# Patient Record
Sex: Female | Born: 1985 | Race: White | Hispanic: No | Marital: Married | Smoking: Former smoker
Health system: Southern US, Community
[De-identification: ages and names within clinical notes are randomized; demographics above are authoritative.]

## PROBLEM LIST (undated history)

## (undated) ENCOUNTER — Inpatient Hospital Stay (HOSPITAL_COMMUNITY): Payer: Self-pay

## (undated) DIAGNOSIS — F329 Major depressive disorder, single episode, unspecified: Secondary | ICD-10-CM

## (undated) DIAGNOSIS — N96 Recurrent pregnancy loss: Secondary | ICD-10-CM

## (undated) DIAGNOSIS — Z8619 Personal history of other infectious and parasitic diseases: Secondary | ICD-10-CM

## (undated) DIAGNOSIS — N200 Calculus of kidney: Secondary | ICD-10-CM

## (undated) DIAGNOSIS — B009 Herpesviral infection, unspecified: Secondary | ICD-10-CM

## (undated) DIAGNOSIS — J45909 Unspecified asthma, uncomplicated: Secondary | ICD-10-CM

## (undated) DIAGNOSIS — F909 Attention-deficit hyperactivity disorder, unspecified type: Secondary | ICD-10-CM

## (undated) DIAGNOSIS — F419 Anxiety disorder, unspecified: Secondary | ICD-10-CM

## (undated) DIAGNOSIS — L650 Telogen effluvium: Secondary | ICD-10-CM

## (undated) DIAGNOSIS — Z8659 Personal history of other mental and behavioral disorders: Secondary | ICD-10-CM

## (undated) DIAGNOSIS — R002 Palpitations: Secondary | ICD-10-CM

## (undated) DIAGNOSIS — F32A Depression, unspecified: Secondary | ICD-10-CM

## (undated) HISTORY — DX: Major depressive disorder, single episode, unspecified: F32.9

## (undated) HISTORY — PX: WISDOM TOOTH EXTRACTION: SHX21

## (undated) HISTORY — PX: DILATION AND CURETTAGE OF UTERUS: SHX78

## (undated) HISTORY — DX: Recurrent pregnancy loss: N96

## (undated) HISTORY — PX: KIDNEY STONE SURGERY: SHX686

## (undated) HISTORY — DX: Personal history of other mental and behavioral disorders: Z86.59

## (undated) HISTORY — DX: Depression, unspecified: F32.A

## (undated) HISTORY — DX: Herpesviral infection, unspecified: B00.9

## (undated) HISTORY — DX: Calculus of kidney: N20.0

## (undated) HISTORY — DX: Attention-deficit hyperactivity disorder, unspecified type: F90.9

## (undated) HISTORY — DX: Anxiety disorder, unspecified: F41.9

## (undated) HISTORY — DX: Telogen effluvium: L65.0

## (undated) HISTORY — DX: Personal history of other infectious and parasitic diseases: Z86.19

## (undated) HISTORY — DX: Palpitations: R00.2

---
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
## 2008-10-08 ENCOUNTER — Emergency Department (HOSPITAL_BASED_OUTPATIENT_CLINIC_OR_DEPARTMENT_OTHER): Admission: EM | Admit: 2008-10-08 | Discharge: 2008-10-08 | Payer: Self-pay | Admitting: Emergency Medicine

## 2009-04-15 ENCOUNTER — Ambulatory Visit (HOSPITAL_COMMUNITY): Admission: RE | Admit: 2009-04-15 | Discharge: 2009-04-15 | Payer: Self-pay | Admitting: Psychiatry

## 2009-04-22 ENCOUNTER — Other Ambulatory Visit (HOSPITAL_COMMUNITY): Admission: RE | Admit: 2009-04-22 | Discharge: 2009-04-26 | Payer: Self-pay | Admitting: Psychiatry

## 2009-05-30 ENCOUNTER — Inpatient Hospital Stay (HOSPITAL_COMMUNITY): Admission: AD | Admit: 2009-05-30 | Discharge: 2009-05-30 | Payer: Self-pay | Admitting: Obstetrics and Gynecology

## 2010-07-08 ENCOUNTER — Inpatient Hospital Stay (HOSPITAL_COMMUNITY)
Admission: AD | Admit: 2010-07-08 | Discharge: 2010-07-08 | Payer: Self-pay | Source: Home / Self Care | Attending: Obstetrics and Gynecology | Admitting: Obstetrics and Gynecology

## 2010-07-08 LAB — WET PREP, GENITAL
Clue Cells Wet Prep HPF POC: NONE SEEN
Trich, Wet Prep: NONE SEEN
Yeast Wet Prep HPF POC: NONE SEEN

## 2010-07-08 LAB — HCG, QUANTITATIVE, PREGNANCY: hCG, Beta Chain, Quant, S: 1676 m[IU]/mL — ABNORMAL HIGH (ref <5)

## 2010-07-09 LAB — GC/CHLAMYDIA PROBE AMP, GENITAL
Chlamydia, DNA Probe: NEGATIVE
GC Probe Amp, Genital: NEGATIVE

## 2010-07-10 ENCOUNTER — Ambulatory Visit (HOSPITAL_COMMUNITY)
Admission: RE | Admit: 2010-07-10 | Discharge: 2010-07-10 | Payer: Self-pay | Source: Home / Self Care | Attending: Obstetrics and Gynecology | Admitting: Obstetrics and Gynecology

## 2010-09-01 ENCOUNTER — Ambulatory Visit (HOSPITAL_COMMUNITY)
Admission: RE | Admit: 2010-09-01 | Discharge: 2010-09-01 | Disposition: A | Discharge status: Home / Self Care | Payer: BC Managed Care – PPO | Source: Ambulatory Visit | Attending: Obstetrics and Gynecology | Admitting: Obstetrics and Gynecology

## 2010-09-01 ENCOUNTER — Other Ambulatory Visit: Payer: Self-pay | Admitting: Obstetrics and Gynecology

## 2010-09-01 DIAGNOSIS — O021 Missed abortion: Secondary | ICD-10-CM | POA: Insufficient documentation

## 2010-09-01 LAB — CBC
HCT: 39.6 % (ref 36.0–46.0)
Hemoglobin: 13.7 g/dL (ref 12.0–15.0)
MCH: 29.8 pg (ref 26.0–34.0)
MCHC: 34.6 g/dL (ref 30.0–36.0)
MCV: 86.3 fL (ref 78.0–100.0)
Platelets: 211 10*3/uL (ref 150–400)
RBC: 4.59 MIL/uL (ref 3.87–5.11)
RDW: 12.8 % (ref 11.5–15.5)
WBC: 9.2 10*3/uL (ref 4.0–10.5)

## 2010-09-01 LAB — ABO/RH: ABO/RH(D): A POS
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
## 2010-09-04 NOTE — Op Note (Signed)
  Angela Vang, Angela Vang              ACCOUNT NO.:  [id number]  MEDICAL RECORD NO.:  [id number]           PATIENT TYPE:  O  LOCATION:  [facility]                          [facility]  [facility]  PHYSICIAN:  Hal Morales, M.D.DATE OF BIRTH:  [birth year]  DATE OF PROCEDURE:  09/01/2010 DATE OF DISCHARGE:                              OPERATIVE REPORT   PREOPERATIVE DIAGNOSIS:  Missed abortion at 7 weeks.  POSTOPERATIVE DIAGNOSIS:  Missed abortion at 7 weeks.  OPERATION:  Suction dilatation and evacuation.  SURGEON:  Chenoa Luddy P. Tou Hayner, MD  ANESTHESIA:  Monitored anesthesia care and local.  ESTIMATED BLOOD LOSS:  Less than 10 mL.  COMPLICATIONS:  None.  FINDINGS:  A moderate amount of products of conception were obtained at the time of D and E, this uterus sounded to 11 cm.  PROCEDURE IN DETAIL:  The patient was taken to the operating room after appropriate identification and placed on the operating table.  After the placement of equipment for monitored anesthesia care, she was placed in lithotomy position.  The perineum and vagina were prepped with multiple layers of Betadine and draped as a sterile field.  A red Robinson catheter was used to empty the bladder.  The perineum was draped as a sterile field.  A Graves speculum was placed in the posterior vagina after an examination under anesthesia was carried out.  The paracervical block was achieved with a total of 10 mL of 2% Xylocaine in the 5 and 7 o'clock positions.  The uterus was grasped with a single-tooth tenaculum on the anterior cervix.  The cervix was then dilated to accommodate a #7 suction catheter and this was used to suction and evacuate all quadrants of the uterus.  The sharp curette was used to ensure that products of conception had all been removed.  Hemostasis was noted to be adequate and all the instruments were removed from the vagina.  The patient was given Methergine 0.2 mg IM and Toradol 30 mg IV and 30 mg IM in  the operating room.  The patient was then awakened from monitored anesthesia care and taken to the recovery room in satisfactory condition having tolerated the procedure well with sponge and instrument counts correct.  SPECIMENS TO PATHOLOGY:  Products of conception.  DISCHARGE INSTRUCTIONS:  Printed instructions for D and E from the [facility].  DISCHARGE MEDICATIONS: 1. Ibuprofen 600 mg p.o. q.6 hours for 3 days and then q.6 hours     p.r.n. pain. 2. Doxycycline 100 mg p.o. b.i.d. for 14 days. 3. Methergine 0.2 mg p.o. q.6 hours for eight doses, these were all     called to [facility] in [place]. 4.  Alesse one p.o. daily starting 09-22-10  FOLLOWUP INSTRUCTIONS:  The patient will follow up in 2 weeks.     Hal Morales, M.D.     VPH/MEDQ  D:  09/01/2010  T:  09-22-2010  Job:  [id number]  Electronically Signed by Dierdre Forth M.D. on 09/04/2010 11:08:02 AM

## 2010-09-07 ENCOUNTER — Inpatient Hospital Stay (HOSPITAL_COMMUNITY): Payer: BC Managed Care – PPO

## 2010-09-07 ENCOUNTER — Other Ambulatory Visit: Payer: Self-pay | Admitting: Obstetrics and Gynecology

## 2010-09-07 ENCOUNTER — Ambulatory Visit (HOSPITAL_COMMUNITY)
Admission: AD | Admit: 2010-09-07 | Discharge: 2010-09-07 | Disposition: A | Discharge status: Home / Self Care | Payer: BC Managed Care – PPO | Source: Ambulatory Visit | Attending: Obstetrics and Gynecology | Admitting: Obstetrics and Gynecology

## 2010-09-07 DIAGNOSIS — IMO0002 Reserved for concepts with insufficient information to code with codable children: Secondary | ICD-10-CM

## 2010-09-07 DIAGNOSIS — O034 Incomplete spontaneous abortion without complication: Secondary | ICD-10-CM | POA: Insufficient documentation

## 2010-09-07 LAB — DIFFERENTIAL
Basophils Absolute: 0 10*3/uL (ref 0.0–0.1)
Basophils Relative: 1 % (ref 0–1)
Eosinophils Absolute: 0.4 10*3/uL (ref 0.0–0.7)
Eosinophils Relative: 5 % (ref 0–5)
Lymphocytes Relative: 39 % (ref 12–46)
Lymphs Abs: 3 10*3/uL (ref 0.7–4.0)
Monocytes Absolute: 0.4 10*3/uL (ref 0.1–1.0)
Monocytes Relative: 5 % (ref 3–12)
Neutro Abs: 3.8 10*3/uL (ref 1.7–7.7)
Neutrophils Relative %: 51 % (ref 43–77)

## 2010-09-07 LAB — CBC
HCT: 35 % — ABNORMAL LOW (ref 36.0–46.0)
Hemoglobin: 12.1 g/dL (ref 12.0–15.0)
MCH: 29.7 pg (ref 26.0–34.0)
MCHC: 34.6 g/dL (ref 30.0–36.0)
MCV: 85.8 fL (ref 78.0–100.0)
Platelets: 206 10*3/uL (ref 150–400)
RBC: 4.08 MIL/uL (ref 3.87–5.11)
RDW: 12.8 % (ref 11.5–15.5)
WBC: 7.6 10*3/uL (ref 4.0–10.5)

## 2010-09-11 DEATH — deceased

## 2010-09-12 LAB — DIFFERENTIAL
Basophils Absolute: 0 10*3/uL (ref 0.0–0.1)
Basophils Relative: 1 % (ref 0–1)
Eosinophils Absolute: 0.2 10*3/uL (ref 0.0–0.7)
Eosinophils Relative: 3 % (ref 0–5)
Lymphocytes Relative: 34 % (ref 12–46)
Lymphs Abs: 2.2 10*3/uL (ref 0.7–4.0)
Monocytes Absolute: 0.9 10*3/uL (ref 0.1–1.0)
Monocytes Relative: 15 % — ABNORMAL HIGH (ref 3–12)
Neutro Abs: 3.1 10*3/uL (ref 1.7–7.7)
Neutrophils Relative %: 48 % (ref 43–77)

## 2010-09-12 LAB — CBC
HCT: 40.5 % (ref 36.0–46.0)
Hemoglobin: 13.8 g/dL (ref 12.0–15.0)
MCHC: 34.1 g/dL (ref 30.0–36.0)
MCV: 89.2 fL (ref 78.0–100.0)
Platelets: 214 10*3/uL (ref 150–400)
RBC: 4.54 MIL/uL (ref 3.87–5.11)
RDW: 13.4 % (ref 11.5–15.5)
WBC: 6.5 10*3/uL (ref 4.0–10.5)

## 2010-09-12 LAB — HCG, QUANTITATIVE, PREGNANCY: hCG, Beta Chain, Quant, S: 9 m[IU]/mL — ABNORMAL HIGH (ref <5)

## 2010-09-12 LAB — ABO/RH: ABO/RH(D): A POS

## 2010-09-14 NOTE — Op Note (Signed)
[name], [name]              ACCOUNT NO.:  [id number]  MEDICAL RECORD NO.:  [id number]           Vang TYPE:  O  LOCATION:  [facility]                          [facility]  PHYSICIAN:  Janine Limbo, M.D.DATE OF BIRTH:  1985/11/02  DATE OF PROCEDURE:  09/07/2010 DATE OF DISCHARGE:  09/07/2010                              OPERATIVE REPORT   PREOPERATIVE DIAGNOSES: 1. Possible retained products of conception after dilatation and     evacuation on September 01, 2010. 2. Vaginal bleeding. 3. Bipolar disease.  POSTOPERATIVE DIAGNOSES: 1. Possible retained products of conception after dilatation and     evacuation on September 01, 2010. 2. Vaginal bleeding. 3. Bipolar disease.  PROCEDURE:  Suction dilatation and evacuation under ultrasound guidance.  SURGEON:  Janine Limbo, MD  FIRST ASSISTANT:  None.  ANESTHETIC:  General.  DISPOSITION:  [name] is a 25 year old female, now para0-0-3-0, who had a dilatation and evacuation on September 01, 2010, because of a spontaneous abortion in the first trimester.  The procedure was uncomplicated.  The Vang has presented for evaluation because of bleeding and discomfort.  An ultrasound was performed in our office which showed a questionable 2-cm area of hyperechogenic material.  The Vang wishes to proceed with repeat uterine evacuation.  The Vang was told of her treatment options.  The risk and benefits of those options were outlined.  The Vang understands and accepts the risks of, but not limited to, anesthetic complications, bleeding, infections, and possible damage to surrounding organs.  FINDINGS:  A minimal amount of old blood clot and possible tissue was removed within the uterine cavity.  The uterus sounded to 10 cm.  The Vang's blood type is A+.  Ultrasound confirmed that there was no evidence of any tissue left inside the uterus at the end of our procedure.  PROCEDURE:  The Vang was taken to the  operating room where she was given medication through her IV line and then mask breathing as a general anesthetic.  The Vang's perineum and vagina were prepped with multiple layers of Betadine.  The bladder was drained of urine.  The Vang was sterilely draped.  Examination under anesthesia was performed.  No adnexal masses were appreciated.  The Vang was found to have a 10-week size uterus.  A paracervical block was placed using 10 mL of 0.5% Marcaine with epinephrine.  The uterus sounded to 10 cm.  The cervix was gently dilated.  The uterine cavity was evacuated using a size 8 suction curette and then a medium sharp curette.  The cavity was felt to be clean at the end of our procedure.  Ultrasound was performed and no tissue or blood clots could be appreciated within the uterine cavity.  Hemostasis was adequate.  The Vang's exam was repeated and the uterus was noted to be 10-week size and firm.  Sponge and instrument counts were correct.  Estimated blood loss for the procedure was 5 mL. The Vang was awakened from her anesthetic without difficulty and transported to the recovery room in stable condition.  The products of conception was sent to pathology.  FOLLOWUP INSTRUCTIONS:  The Vang will return to see Dr. Stefano Gaul in 2 weeks.  She will call for questions or concerns.  She was given a copy of the postoperative instruction sheet as prepared by the [facility] for patients who have undergone a dilatation and evacuation.  The Vang already has a prescription at home for Motrin and for Vicodin.  She was given a prescription for Phenergan 25 mg and she can take 1 tablet every 6 hours as needed for nausea.  The Vang has one additional day of doxycycline at home that she can take.     Janine Limbo, M.D.     AVS/MEDQ  D:  09/07/2010  T:  09/08/2010  Job:  [id number]  Electronically Signed by Kirkland Hun M.D. on 09/14/2010 11:06:37 AM

## 2011-04-19 ENCOUNTER — Telehealth: Payer: Self-pay | Admitting: Family Medicine

## 2011-04-19 ENCOUNTER — Ambulatory Visit: Payer: BC Managed Care – PPO | Admitting: Internal Medicine

## 2011-04-19 NOTE — Telephone Encounter (Signed)
Ok to reschedule . Make sure contact numbers are correct.

## 2011-04-19 NOTE — Telephone Encounter (Signed)
Pt got your message. Called back and says she just completely forgot about the appt. Would like to resched. With [facility] - please advise.

## 2011-04-20 NOTE — Telephone Encounter (Signed)
Called pt, LMOM.  

## 2011-06-05 ENCOUNTER — Ambulatory Visit: Payer: BC Managed Care – PPO | Admitting: Internal Medicine

## 2011-08-02 ENCOUNTER — Encounter: Payer: Self-pay | Admitting: Internal Medicine

## 2011-08-02 ENCOUNTER — Ambulatory Visit (INDEPENDENT_AMBULATORY_CARE_PROVIDER_SITE_OTHER): Payer: BC Managed Care – PPO | Admitting: Internal Medicine

## 2011-08-02 VITALS — BP 100/60 | HR 72 | Ht 66.25 in | Wt 161.0 lb

## 2011-08-02 DIAGNOSIS — F988 Other specified behavioral and emotional disorders with onset usually occurring in childhood and adolescence: Secondary | ICD-10-CM | POA: Insufficient documentation

## 2011-08-02 DIAGNOSIS — F419 Anxiety disorder, unspecified: Secondary | ICD-10-CM | POA: Insufficient documentation

## 2011-08-02 DIAGNOSIS — F329 Major depressive disorder, single episode, unspecified: Secondary | ICD-10-CM

## 2011-08-02 DIAGNOSIS — N96 Recurrent pregnancy loss: Secondary | ICD-10-CM

## 2011-08-02 DIAGNOSIS — F32A Depression, unspecified: Secondary | ICD-10-CM | POA: Insufficient documentation

## 2011-08-02 DIAGNOSIS — F411 Generalized anxiety disorder: Secondary | ICD-10-CM

## 2011-08-02 DIAGNOSIS — F909 Attention-deficit hyperactivity disorder, unspecified type: Secondary | ICD-10-CM

## 2011-08-02 MED ORDER — AMPHETAMINE-DEXTROAMPHET ER 10 MG PO CP24: 10.0000 mg | ORAL_CAPSULE | Freq: Every day | ORAL | Status: DC

## 2011-08-02 NOTE — Patient Instructions (Addendum)
You should see the psychiatry counselors for the depression and anxiety  Intervention.   Mindfulness tracking  About your sleep.  work and eating cycle. Sleep issues can affect mood and vise versa.   Body like a regular sleep cycle.  Cognitive threapy and other meds  May help.  ADHD med can be used  For now but call if makes anxiety or depression worse.  Start low with the med and then increase to 20 mg per day . It is better taken every day to help the attention problem  On a regular basis.  Plan rov in a month or so about the adhd med.

## 2011-08-02 NOTE — Progress Notes (Signed)
Subjective:    Patient ID: Angela Vang, female    DOB: August 31, 1985, 26 y.o.   MRN: [id number]  HPI Patient comes in as new patient visit . Previous care was via Dr Areatha Keas.  Pt mom comes to our practice; was rec to [place].  Problems with management of sx of ADD depression and now anxiety.   ADD/ADHDDx age 17 [employer] .  Off andon meds   4-5th grade.  Off and off meds   adderall and ritalin helped some.  Had some dec appetite with long acting meds Now affecting job.   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  Works [place] [employer]. Preferred member program.  Working for [employer] for [employer] years .  ? Had IEP.  And on and off  Both brother on  ADHD meds . 27  And 21   and father    No recent counselor.    Has been to in past and doesn't think that helpful but has had a lot of changes with pregnancy and loss  Off and on meds .  Hard to fly sometimes.  Now getting panicy sx at times and co for anxiety , Was seen  By presby in past but never stayed on meds very long  Also per PCP lexapro and celexa  Has had depression and suicidal throughts in the past but no hosp and no attempts per se.   Migraines  Off an on worse with job issues.  Hx of depression per counselor.   Age 4 yrs.  Off an on.  Was on meds  A year ago and   Spotted.  because of  Pregnancy and has 2 miscairrages so far.  12 weeks or so or less.    Sees dr Pennie Rushing.   Tobacco   ocass  etoh once a week. Denies RD Review of Systems ROS:  GEN/ HEENT: No fever, significant weight changes sweats vision problems hearing changes, CV/ PULM; No chest pain shortness of breath cough, syncope,edema  change in exercise tolerance. GI /GU: No adominal pain, vomiting, change in bowel habits. No blood in the stool. No significant GU symptoms. SKIN/HEME: ,no acute skin rashes suspicious lesions or bleeding. No lymphadenopathy, nodules, masses.  NEURO/ PSYCH:  No neurologic signs such as weakness numbness. IMM/ Allergy: No unusual infections.  Allergy .   REST of  12 system review negative except as per HPI     Objective:   Physical Exam  Well-developed well-nourished no acute distress she is tearful and crying through much of the interview.otherwise in nad and looks healthy HEENT: Normocephalic ;atraumatic , Eyes;  PERRL, EOMs  Full, lids and conjunctiva clear,,Ears: no deformities, canals nl, TM landmarks normal, Nose: no deformity or discharge  Mouth : OP clear without lesion or edema . Neck: Supple without adenopathy or masses or bruits Chest:  Clear to A&P without wheezes rales or rhonchi CV:  S1-S2 no gallops or murmurs peripheral perfusion is normal Abdomen:  Sof,t normal bowel sounds without hepatosplenomegaly, no guarding rebound or masses no CVA tenderness No clubbing cyanosis or edema Oriented x 3 and no noted deficits in memory, , and speech. Skin few acne on face otherwise nl cap refill and color. NEURO: oriented x 3 CN 3-12 appear intact. No focal muscle weakness or atrophy.Gait WNL.  Grossly non focal. No tremor or abnormal movement.   Reviewed prev pcp records  available     Assessment & Plan:  ADHD  By report from early age  Hx  of med use . Been on medication for a while apparently had no significant side effects except for decreased appetite. She asks about immediate release Adderall but I would recommend a longer acting use appropriately to avoid the ups and downs in reducing. Especially with her irregular work schedule middle of the night and anxiety depression. Discussed use of medicine could aggravate the situation but probably will not in this context.     She should have under her sleep and taking her medicine and come back a month. Will start very low at 10 mg XL R. and increase to 20 mg XR we discussed longer acting but she's not interested in this at this time because of concern about potential side effects.  Anxiety and depression Disc that she should get back in to specialty care with her hxat this point and hx of  "?failure "with meds in the past disc strategies to  Focus on the problems at hand . At this time would advise that they manage any  benzo  And anxiety depress meds   Because of her complex hx.  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  eventulally should deal with her preg losses also.   ocass tobacco  Advise stop altogether .  Prolonged visit today Total visit 45 mins > 50% spent counseling and coordinating care  Records review and plan with patient.

## 2011-08-02 NOTE — Assessment & Plan Note (Signed)
Ongoing many factors has never been on medication long enough to tell if it helps for various reasons such as pregnancy other factors discussed this at length with patient that she needs to be in any comprehensive behavioral healthcare or with continuity.  Consider cognitive therapy in addition to what other medicine may be appropriate. She should try to summarize medication she has been on before that she thinks may have helped or not helped. This will help the clinician. She seems discouraged that these things will help but is willing to do this

## 2011-08-02 NOTE — Assessment & Plan Note (Signed)
New onset according to patient over the last year or so could be reactive to many things in her life some panicky symptoms at time with her underlying depression and other factors such as ADD pregnancy loss  again would recommend counseling such as cognitive therapy plus medication its best provided by a specialist. She's been on medication before we will prescribe the ADHD medication as a trial with caution that it could make anxiety worse although perhaps may make it better for her to help her concentrate and get through her  daily obligations.  No substance use or other factors seen today

## 2011-08-05 ENCOUNTER — Encounter: Payer: Self-pay | Admitting: Internal Medicine

## 2011-08-05 DIAGNOSIS — N96 Recurrent pregnancy loss: Secondary | ICD-10-CM

## 2011-08-05 HISTORY — DX: Recurrent pregnancy loss: N96

## 2011-08-30 ENCOUNTER — Ambulatory Visit: Payer: BC Managed Care – PPO | Admitting: Internal Medicine

## 2011-10-09 ENCOUNTER — Ambulatory Visit: Payer: Self-pay | Admitting: Obstetrics and Gynecology

## 2011-10-30 ENCOUNTER — Encounter: Payer: Self-pay | Admitting: Obstetrics and Gynecology

## 2011-11-01 ENCOUNTER — Ambulatory Visit: Payer: Self-pay | Admitting: Obstetrics and Gynecology

## 2011-11-21 ENCOUNTER — Other Ambulatory Visit: Payer: Self-pay | Admitting: Obstetrics and Gynecology

## 2011-11-21 NOTE — Telephone Encounter (Signed)
Angela Vang/pt last seen in 2012

## 2011-11-22 MED ORDER — LEVONORGESTREL-ETHINYL ESTRAD 0.1-20 MG-MCG PO TABS: 1.0000 | ORAL_TABLET | Freq: Every day | ORAL | Status: DC

## 2011-11-22 NOTE — Telephone Encounter (Signed)
Tc to pt per telephone call. Unable to leave vm due to no mailbox setup. Rx for Aviane e-pres to pharm on file. AEX sched 01-10-12 with [facility].

## 2012-01-10 ENCOUNTER — Ambulatory Visit: Payer: BC Managed Care – PPO | Admitting: Obstetrics and Gynecology

## 2012-09-16 ENCOUNTER — Encounter (HOSPITAL_COMMUNITY): Payer: Self-pay

## 2012-09-16 ENCOUNTER — Inpatient Hospital Stay (HOSPITAL_COMMUNITY): Payer: BC Managed Care – PPO

## 2012-09-16 ENCOUNTER — Inpatient Hospital Stay (HOSPITAL_COMMUNITY)
Admission: AD | Admit: 2012-09-16 | Discharge: 2012-09-16 | Disposition: A | Discharge status: Home / Self Care | Payer: BC Managed Care – PPO | Source: Ambulatory Visit | Attending: Obstetrics and Gynecology | Admitting: Obstetrics and Gynecology

## 2012-09-16 DIAGNOSIS — O26859 Spotting complicating pregnancy, unspecified trimester: Secondary | ICD-10-CM | POA: Insufficient documentation

## 2012-09-16 DIAGNOSIS — O200 Threatened abortion: Secondary | ICD-10-CM

## 2012-09-16 DIAGNOSIS — R109 Unspecified abdominal pain: Secondary | ICD-10-CM | POA: Insufficient documentation

## 2012-09-16 LAB — URINALYSIS, ROUTINE W REFLEX MICROSCOPIC
Bilirubin Urine: NEGATIVE
Glucose, UA: NEGATIVE mg/dL
Hgb urine dipstick: NEGATIVE
Ketones, ur: NEGATIVE mg/dL
Leukocytes, UA: NEGATIVE
Nitrite: NEGATIVE
Protein, ur: NEGATIVE mg/dL
Specific Gravity, Urine: 1.025 (ref 1.005–1.030)
Urobilinogen, UA: 0.2 mg/dL (ref 0.0–1.0)
pH: 6 (ref 5.0–8.0)

## 2012-09-16 LAB — CBC
HCT: 37.6 % (ref 36.0–46.0)
Hemoglobin: 13.1 g/dL (ref 12.0–15.0)
MCH: 29.7 pg (ref 26.0–34.0)
MCHC: 34.8 g/dL (ref 30.0–36.0)
MCV: 85.3 fL (ref 78.0–100.0)
Platelets: 266 10*3/uL (ref 150–400)
RBC: 4.41 MIL/uL (ref 3.87–5.11)
RDW: 13.1 % (ref 11.5–15.5)
WBC: 10.5 10*3/uL (ref 4.0–10.5)

## 2012-09-16 LAB — WET PREP, GENITAL
Clue Cells Wet Prep HPF POC: NONE SEEN
Trich, Wet Prep: NONE SEEN
Yeast Wet Prep HPF POC: NONE SEEN

## 2012-09-16 LAB — POCT PREGNANCY, URINE: Preg Test, Ur: POSITIVE — AB

## 2012-09-16 LAB — HCG, QUANTITATIVE, PREGNANCY: hCG, Beta Chain, Quant, S: 2572 m[IU]/mL — ABNORMAL HIGH (ref <5)

## 2012-09-16 NOTE — MAU Provider Note (Signed)
History     CSN: [id number]  Arrival date and time: 09/16/12 1718   None     Chief Complaint  Patient presents with  . Possible Pregnancy  . Abdominal Pain   HPI This is a 27 y.o. female at 5+ weeks by LMP who presents with report of brown spotting today and some cramping off and on.  History is remarkable for 3 SABs, and patient is "not getting my hopes up".  Previously seen by [facility] but now goes to Dr Henderson Cloud.   RN Note: Patient states she has had a positive home pregnancy test. States she has had lower abdominal sharp pain off and on for about one week. Has a clear vaginal discharge with brown discharge.   OB History   Grav Para Term Preterm Abortions TAB SAB Ect Mult Living   3 0   2  2        Obstetric Comments   2 miscarriages      Past Medical History  Diagnosis Date  . Depression   . Anxiety   . History of chickenpox   . Headache     Past Surgical History  Procedure Laterality Date  . Wisdom tooth extraction    . Dilation and curettage of uterus      Family History  Problem Relation Age of Onset  . Diabetes Mother   . Depression Father   . Bipolar disorder Father   . Leukemia Maternal Grandmother     History  Substance Use Topics  . Smoking status: Never Smoker   . Smokeless tobacco: Former Neurosurgeon    Quit date: 09/17/2010  . Alcohol Use: No     Comment: Once a week    Allergies: No Known Allergies  Prescriptions prior to admission  Medication Sig Dispense Refill  . Prenatal Vit-Fe Fumarate-FA (PRENATAL MULTIVITAMIN) TABS Take 1 tablet by mouth daily at 12 noon.        Review of Systems  Constitutional: Negative for fever, chills and malaise/fatigue.  Gastrointestinal: Positive for abdominal pain. Negative for nausea, vomiting, diarrhea and constipation.  Genitourinary: Negative for dysuria.       Brown mucous this morning  Neurological: Negative for dizziness, weakness and headaches.   Physical Exam   Blood pressure 125/75, pulse 95,  temperature 98.8 F (37.1 C), temperature source Oral, resp. rate 16, height 5' 5.5" (1.664 m), weight 182 lb 12.8 oz (82.918 kg), last menstrual period 08/07/2012, SpO2 100.00%.  Physical Exam  Constitutional: She is oriented to person, place, and time. She appears well-developed and well-nourished. No distress.  HENT:  Head: Normocephalic.  Cardiovascular: Normal rate.   Respiratory: Effort normal.  GI: Soft. She exhibits no distension and no mass. There is no tenderness. There is no rebound and no guarding.  Genitourinary: Uterus normal. Vaginal discharge (thin white, no color to it at all) found.  Musculoskeletal: Normal range of motion.  Neurological: She is alert and oriented to person, place, and time.  Skin: Skin is warm and dry.  Psychiatric: She has a normal mood and affect.   Results for orders placed during the hospital encounter of 09/16/12 (from the past 72 hour(s))  URINALYSIS, ROUTINE W REFLEX MICROSCOPIC     Status: None   Collection Time    09/16/12  6:00 PM      Result Value Range   Color, Urine YELLOW  YELLOW   APPearance CLEAR  CLEAR   Specific Gravity, Urine 1.025  1.005 - 1.030   pH 6.0  5.0 - 8.0   Glucose, UA NEGATIVE  NEGATIVE mg/dL   Hgb urine dipstick NEGATIVE  NEGATIVE   Bilirubin Urine NEGATIVE  NEGATIVE   Ketones, ur NEGATIVE  NEGATIVE mg/dL   Protein, ur NEGATIVE  NEGATIVE mg/dL   Urobilinogen, UA 0.2  0.0 - 1.0 mg/dL   Nitrite NEGATIVE  NEGATIVE   Leukocytes, UA NEGATIVE  NEGATIVE   Comment: MICROSCOPIC NOT DONE ON URINES WITH NEGATIVE PROTEIN, BLOOD, LEUKOCYTES, NITRITE, OR GLUCOSE <1000 mg/dL.  POCT PREGNANCY, URINE     Status: Abnormal   Collection Time    09/16/12  6:02 PM      Result Value Range   Preg Test, Ur POSITIVE (*) NEGATIVE   Comment:            THE SENSITIVITY OF THIS     METHODOLOGY IS >24 mIU/mL  HCG, QUANTITATIVE, PREGNANCY     Status: Abnormal   Collection Time    09/16/12  6:19 PM      Result Value Range   hCG, Beta  Chain, Quant, S 2572 (*) <5 mIU/mL   Comment:              GEST. AGE      CONC.  (mIU/mL)       <=1 WEEK        5 - 50         2 WEEKS       50 - 500         3 WEEKS       100 - 10,000         4 WEEKS     1,000 - 30,000         5 WEEKS     3,500 - 115,000       6-8 WEEKS     12,000 - 270,000        12 WEEKS     15,000 - 220,000                FEMALE AND NON-PREGNANT FEMALE:         LESS THAN 5 mIU/mL  CBC     Status: None   Collection Time    09/16/12  6:20 PM      Result Value Range   WBC 10.5  4.0 - 10.5 K/uL   RBC 4.41  3.87 - 5.11 MIL/uL   Hemoglobin 13.1  12.0 - 15.0 g/dL   HCT 16.1  09.6 - 04.5 %   MCV 85.3  78.0 - 100.0 fL   MCH 29.7  26.0 - 34.0 pg   MCHC 34.8  30.0 - 36.0 g/dL   RDW 40.9  81.1 - 91.4 %   Platelets 266  150 - 400 K/uL  WET PREP, GENITAL     Status: Abnormal   Collection Time    09/16/12  6:32 PM      Result Value Range   Yeast Wet Prep HPF POC NONE SEEN  NONE SEEN   Trich, Wet Prep NONE SEEN  NONE SEEN   Clue Cells Wet Prep HPF POC NONE SEEN  NONE SEEN   WBC, Wet Prep HPF POC MODERATE (*) NONE SEEN   Comment: MANY BACTERIA SEEN   US Ob Transvaginal  09/16/2012  *RADIOLOGY REPORT*  Clinical Data: Spotting and cramping.  History of prior spontaneous abortions.  OBSTETRIC <14 WK [place] AND TRANSVAGINAL OB US  Technique:  Both transabdominal and transvaginal ultrasound examinations were performed for complete evaluation of  the gestation as well as the maternal uterus, adnexal regions, and pelvic cul-de-sac.  Transvaginal technique was performed to assess early pregnancy.  Comparison:  None for this pregnancy.  Intrauterine gestational sac:  Single.  Normal in shape. Yolk sac: Likely present. Embryo: Not visualized Cardiac Activity: Not visualized  MSD: 3.3 mm  4 w 6 d      EDC: 05/20/2013.  Maternal uterus/adnexae: Uterus is unremarkable.  There is no significant subchorionic hemorrhage.  A corpus luteal cyst is noted on the right.  Minimal free fluid is present.   IMPRESSION:  1.  Single intrauterine gestational sac the with a probable yolk sac. 2.  The embryo is not visualized. Probable early intrauterine gestational sac, but no definite yolk sac, fetal pole, or cardiac activity yet visualized.  Recommend follow-up quantitative B-HCG levels and follow-up US in 14 days to confirm and assess viability. This recommendation follows SRU consensus guidelines: Diagnostic Criteria for Nonviable Pregnancy Early in the First Trimester.  Malva Limes Med 2013; 161:0960-45.   Original Report Authenticated By: Marin Roberts, M.D.    MAU Course  Procedures  MDM Discussed with Dr Vincente Poli and patient.  Has appt for next week for ultrasound, so will keep that. Bleeding precautions reviewed.  Assessment and Plan  A:  SIUP at 4.6 weeks by today's [place]       Probable yolk sac seen  P:  Discussed with Dr Vincente Poli      Plan "Pregnancy Confirmation Ultrasound" in their office between 6-7 weeks  [facility] 09/16/2012, 6:14 PM

## 2012-09-16 NOTE — MAU Note (Signed)
Patient states she has had a positive home pregnancy test. States she has had lower abdominal sharp pain off and on for about one week. Has a clear vaginal discharge with brown discharge.

## 2012-09-17 LAB — GC/CHLAMYDIA PROBE AMP
CT Probe RNA: NEGATIVE
GC Probe RNA: NEGATIVE

## 2012-10-04 LAB — OB RESULTS CONSOLE ABO/RH: RH Type: POSITIVE

## 2012-10-04 LAB — OB RESULTS CONSOLE ANTIBODY SCREEN: Antibody Screen: NEGATIVE

## 2012-10-04 LAB — OB RESULTS CONSOLE GC/CHLAMYDIA
Chlamydia: NEGATIVE
Gonorrhea: NEGATIVE

## 2012-10-04 LAB — OB RESULTS CONSOLE RPR: RPR: NONREACTIVE

## 2012-10-04 LAB — OB RESULTS CONSOLE HEPATITIS B SURFACE ANTIGEN: Hepatitis B Surface Ag: NEGATIVE

## 2012-10-04 LAB — OB RESULTS CONSOLE HIV ANTIBODY (ROUTINE TESTING): HIV: NONREACTIVE

## 2012-10-04 LAB — OB RESULTS CONSOLE RUBELLA ANTIBODY, IGM: Rubella: IMMUNE

## 2012-10-05 ENCOUNTER — Encounter (HOSPITAL_COMMUNITY): Payer: Self-pay | Admitting: *Deleted

## 2012-10-05 ENCOUNTER — Inpatient Hospital Stay (HOSPITAL_COMMUNITY)
Admission: AD | Admit: 2012-10-05 | Discharge: 2012-10-05 | Disposition: A | Discharge status: Home / Self Care | Payer: BC Managed Care – PPO | Source: Ambulatory Visit | Attending: Obstetrics and Gynecology | Admitting: Obstetrics and Gynecology

## 2012-10-05 ENCOUNTER — Inpatient Hospital Stay (HOSPITAL_COMMUNITY): Payer: BC Managed Care – PPO

## 2012-10-05 DIAGNOSIS — O209 Hemorrhage in early pregnancy, unspecified: Secondary | ICD-10-CM

## 2012-10-05 DIAGNOSIS — R109 Unspecified abdominal pain: Secondary | ICD-10-CM | POA: Insufficient documentation

## 2012-10-05 LAB — URINALYSIS, ROUTINE W REFLEX MICROSCOPIC
Bilirubin Urine: NEGATIVE
Glucose, UA: NEGATIVE mg/dL
Hgb urine dipstick: NEGATIVE
Ketones, ur: 15 mg/dL — AB
Leukocytes, UA: NEGATIVE
Nitrite: NEGATIVE
Protein, ur: NEGATIVE mg/dL
Specific Gravity, Urine: 1.02 (ref 1.005–1.030)
Urobilinogen, UA: 0.2 mg/dL (ref 0.0–1.0)
pH: 5.5 (ref 5.0–8.0)

## 2012-10-05 NOTE — MAU Provider Note (Signed)
History     CSN: [id number]  Arrival date and time: 10/05/12 1356   First Provider Initiated Contact with Patient 10/05/12 1439      Chief Complaint  Patient presents with  . Abdominal Cramping   HPI Angela Vang [redacted]w[redacted]d Comes to MAU with vaginal bleeding - red earlier today.  Additionally has some periodic stabbing lower abdominal pain.  History of previous miscarriages.  Client is very tearful.  OB History   Grav Para Term Preterm Abortions TAB SAB Ect Mult Living   4 0   3  3        Obstetric Comments   2 miscarriages      Past Medical History  Diagnosis Date  . Depression   . Anxiety   . History of chickenpox   . Headache     Past Surgical History  Procedure Laterality Date  . Wisdom tooth extraction    . Dilation and curettage of uterus      Family History  Problem Relation Age of Onset  . Diabetes Mother   . Depression Father   . Bipolar disorder Father   . Leukemia Maternal Grandmother     History  Substance Use Topics  . Smoking status: Never Smoker   . Smokeless tobacco: Former Neurosurgeon    Quit date: 09/17/2010  . Alcohol Use: No     Comment: Once a week    Allergies: No Known Allergies  Prescriptions prior to admission  Medication Sig Dispense Refill  . cetirizine (ZYRTEC) 10 MG tablet Take 10 mg by mouth daily.      . Docosahexaenoic Acid (DHA PO) Take 1 tablet by mouth daily.      . Prenatal Vit-Fe Fumarate-FA (PRENATAL MULTIVITAMIN) TABS Take 1 tablet by mouth daily at 12 noon.      . progesterone (PROMETRIUM) 100 MG capsule Take 100 mg by mouth 2 (two) times daily.        Review of Systems  Constitutional: Negative for fever.  Gastrointestinal: Positive for abdominal pain. Negative for nausea, vomiting, diarrhea and constipation.  Genitourinary:       Vaginal bleeding. No dysuria.   Physical Exam   Blood pressure 104/58, pulse 86, temperature 99 F (37.2 C), temperature source Oral, resp. rate 18, height 5' 5.5" (1.664 m), weight  184 lb 12.8 oz (83.825 kg), last menstrual period 08/07/2012.  Physical Exam  Nursing note and vitals reviewed. Constitutional: She is oriented to person, place, and time. She appears well-developed and well-nourished.  HENT:  Head: Normocephalic.  Eyes: EOM are normal.  Neck: Neck supple.  GI: Soft. There is no tenderness.  Genitourinary:  Speculum exam - cervix appears closed.  No blood seen in vagina.  No active bleeding.  Musculoskeletal: Normal range of motion.  Neurological: She is alert and oriented to person, place, and time.  Skin: Skin is warm and dry.  Psychiatric: She has a normal mood and affect.    MAU Course  Procedures  MDM Clinical Data: Early pregnancy. Spotting and cramping.  TRANSVAGINAL OBSTETRIC US  Technique: Transvaginal ultrasound was performed for complete  evaluation of the gestation as well as the maternal uterus, adnexal  regions, and pelvic cul-de-sac.  Comparison: 09/16/2012.  Intrauterine gestational sac: A single ovoid shaped gestational sac  in the fundal portion of the endometrial cavity.  Yolk sac: Present.  Embryo: Present.  Cardiac Activity: Present.  Heart Rate: 167  CRL: 13.8 mm 7 w 5 d [place] EDC: 05/19/2013.  Subchorionic hemorrhage: Small  Maternal uterus/adnexae:  Probable degenerating corpus luteum cyst in the right ovary  incidentally noted. The ovaries are otherwise unremarkable in  appearance bilaterally. Trace volume of free fluid the cul-de-sac.  IMPRESSION:  1. Single viable IUP with an estimated gestational age of [redacted] weeks  5 days and normal fetal heart rate of 167 beats per minute.  2. Small amount of subchorionic hemorrhage.  3. Trace volume of free fluid the cul-de-sac.  4. Probable degenerating corpus luteum cyst in the right ovary  incidentally noted.   Client very relieved that heart rate was seen.  Discussed subchorionic hemorrhage with client. Assessment and Plan  Bleeding in pregnancy  - likely due to a small  subchorionic hemorrhage.  Plan No sex while bleeding Take Tylenol 325 mg 2 tablets by mouth every 4 hours if needed for pain.  Lauretta Sallas 10/05/2012, 3:44 PM

## 2012-10-05 NOTE — MAU Note (Signed)
Pt reports she started having sharp pains and cramping today Noticed some spotting as well.

## 2012-11-26 ENCOUNTER — Encounter (HOSPITAL_COMMUNITY): Payer: Self-pay

## 2012-11-26 ENCOUNTER — Inpatient Hospital Stay (HOSPITAL_COMMUNITY): Payer: BC Managed Care – PPO

## 2012-11-26 ENCOUNTER — Inpatient Hospital Stay (HOSPITAL_COMMUNITY)
Admission: AD | Admit: 2012-11-26 | Discharge: 2012-11-26 | Disposition: A | Discharge status: Home / Self Care | Payer: BC Managed Care – PPO | Source: Ambulatory Visit | Attending: Obstetrics and Gynecology | Admitting: Obstetrics and Gynecology

## 2012-11-26 DIAGNOSIS — O26899 Other specified pregnancy related conditions, unspecified trimester: Secondary | ICD-10-CM

## 2012-11-26 DIAGNOSIS — R1032 Left lower quadrant pain: Secondary | ICD-10-CM | POA: Insufficient documentation

## 2012-11-26 DIAGNOSIS — R109 Unspecified abdominal pain: Secondary | ICD-10-CM

## 2012-11-26 DIAGNOSIS — O99891 Other specified diseases and conditions complicating pregnancy: Secondary | ICD-10-CM | POA: Insufficient documentation

## 2012-11-26 LAB — URINALYSIS, ROUTINE W REFLEX MICROSCOPIC
Bilirubin Urine: NEGATIVE
Glucose, UA: NEGATIVE mg/dL
Hgb urine dipstick: NEGATIVE
Ketones, ur: NEGATIVE mg/dL
Leukocytes, UA: NEGATIVE
Nitrite: NEGATIVE
Protein, ur: NEGATIVE mg/dL
Specific Gravity, Urine: 1.02 (ref 1.005–1.030)
Urobilinogen, UA: 0.2 mg/dL (ref 0.0–1.0)
pH: 6.5 (ref 5.0–8.0)

## 2012-11-26 NOTE — MAU Provider Note (Signed)
History     CSN: [id number]  Arrival date and time: 11/26/12 1813   First Provider Initiated Contact with Patient 11/26/12 2000      Chief Complaint  Patient presents with  . Abdominal Pain   HPI  Pt is [redacted]w[redacted]d G3 TAB1 SAB2 P0-pregnant and presents with abdominal cramping and pain.  Pt denies vaginal bleeding or vaginal discharge.  Pt denies nausea or vomiting, diarrhea or UTI symptoms.  Pt has had regular bowel movements but has had increase in gas. Pt's pain is described as sharp shooting pain esp LLQ and radiating to lower mid abdomen.  Pt's pain is not relieved or worsened by position change.  Pt ws seen in the office last week. Nurses note: Patient is in with c/o progressively intense left lower quadrant pain that radiates to her pelvic/ cervix per patient. She denies vaginal bleeding, abnormal vaginal discharge or dysuria. She was relieved that FHT was heard during triage today. Patient is concerned due to her previous 2 miscarriages. (1 TAB)  Past Medical History  Diagnosis Date  . Depression   . Anxiety   . History of chickenpox   . [id number](478.2)     Past Surgical History  Procedure Laterality Date  . Wisdom tooth extraction    . Dilation and curettage of uterus      Family History  Problem Relation Age of Onset  . Diabetes Mother   . Depression Father   . Bipolar disorder Father   . Leukemia Maternal Grandmother     History  Substance Use Topics  . Smoking status: Never Smoker   . Smokeless tobacco: Former Neurosurgeon    Quit date: 09/17/2010  . Alcohol Use: No     Comment: Once a week    Allergies: No Known Allergies  Prescriptions prior to admission  Medication Sig Dispense Refill  . acetaminophen (TYLENOL) 325 MG tablet Take 650 mg by mouth every 6 (six) hours as needed for pain (For headache.).      Marland Kitchen calcium carbonate (TUMS EX) 750 MG chewable tablet Chew 2 tablets by mouth daily as needed for heartburn.      . cetirizine (ZYRTEC) 10 MG tablet Take 10  mg by mouth daily.      . Docosahexaenoic Acid (DHA PO) Take 1 tablet by mouth daily.      . Prenatal Vit-Fe Fumarate-FA (PRENATAL MULTIVITAMIN) TABS Take 1 tablet by mouth daily at 12 noon.        Review of Systems  Constitutional: Negative for fever and chills.  Gastrointestinal: Positive for abdominal pain. Negative for nausea, vomiting, diarrhea and constipation.  Genitourinary: Negative for dysuria and urgency.   Physical Exam   Blood pressure 110/66, pulse 89, temperature 98.7 F (37.1 C), temperature source Oral, resp. rate 16, height 5\' 6"  (1.676 m), weight 85.276 kg (188 lb), last menstrual period 08/07/2012, SpO2 100.00%.  Physical Exam  Nursing note and vitals reviewed. Constitutional: She appears well-developed and well-nourished. No distress.  HENT:  Head: Normocephalic.  Eyes: Pupils are equal, round, and reactive to light.  Neck: Normal range of motion. Neck supple.  Cardiovascular: Normal rate.   Respiratory: Effort normal.  GI: Soft. Bowel sounds are normal. She exhibits no distension. There is tenderness. There is no rebound.  Pt has LUQ tenderness with palpation and LLQ tenderness with palpation- no rebound    MAU Course  Procedures Results for orders placed during the hospital encounter of 11/26/12 (from the past 24 hour(s))  URINALYSIS, ROUTINE W REFLEX  MICROSCOPIC     Status: None   Collection Time    11/26/12  6:40 PM      Result Value Range   Color, Urine YELLOW  YELLOW   APPearance CLEAR  CLEAR   Specific Gravity, Urine 1.020  1.005 - 1.030   pH 6.5  5.0 - 8.0   Glucose, UA NEGATIVE  NEGATIVE mg/dL   Hgb urine dipstick NEGATIVE  NEGATIVE   Bilirubin Urine NEGATIVE  NEGATIVE   Ketones, ur NEGATIVE  NEGATIVE mg/dL   Protein, ur NEGATIVE  NEGATIVE mg/dL   Urobilinogen, UA 0.2  0.0 - 1.0 mg/dL   Nitrite NEGATIVE  NEGATIVE   Leukocytes, UA NEGATIVE  NEGATIVE   Discussed with Dr. Rana Snare and pt Will advise diet change for pt and symptomatic  relief Assessment and Plan  abd pain in pregnancy Recommend bland diet F/u with scheduled appointment  Yailene Badia 11/26/2012, 8:00 PM

## 2012-11-26 NOTE — MAU Note (Signed)
Patient is in with c/o progressively intense left lower quadrant pain that radiates to her pelvic/ cervix per patient. She denies vaginal bleeding, abnormal vaginal discharge or dysuria. She was relieved that FHT was heard during triage today. Patient is concerned due to her previous 3 miscarriages.

## 2012-11-26 NOTE — MAU Note (Signed)
Patient states she has been having slight abdominal pain for about one week. Has gotten worse today, is sharp and comes and goes. Denies bleeding or discharge.

## 2012-12-26 ENCOUNTER — Encounter (HOSPITAL_COMMUNITY): Payer: Self-pay | Admitting: *Deleted

## 2012-12-26 ENCOUNTER — Inpatient Hospital Stay (HOSPITAL_COMMUNITY)
Admission: AD | Admit: 2012-12-26 | Discharge: 2012-12-26 | Disposition: A | Discharge status: Home / Self Care | Payer: BC Managed Care – PPO | Source: Ambulatory Visit | Attending: Obstetrics and Gynecology | Admitting: Obstetrics and Gynecology

## 2012-12-26 DIAGNOSIS — R079 Chest pain, unspecified: Secondary | ICD-10-CM | POA: Insufficient documentation

## 2012-12-26 DIAGNOSIS — R0602 Shortness of breath: Secondary | ICD-10-CM | POA: Insufficient documentation

## 2012-12-26 DIAGNOSIS — R109 Unspecified abdominal pain: Secondary | ICD-10-CM

## 2012-12-26 DIAGNOSIS — O26899 Other specified pregnancy related conditions, unspecified trimester: Secondary | ICD-10-CM

## 2012-12-26 DIAGNOSIS — O99891 Other specified diseases and conditions complicating pregnancy: Secondary | ICD-10-CM | POA: Insufficient documentation

## 2012-12-26 DIAGNOSIS — K219 Gastro-esophageal reflux disease without esophagitis: Secondary | ICD-10-CM

## 2012-12-26 MED ORDER — FAMOTIDINE 20 MG PO TABS: 20.0000 mg | ORAL_TABLET | Freq: Two times a day (BID) | ORAL | Status: DC

## 2012-12-26 MED ORDER — GI COCKTAIL ~~LOC~~
30.0000 mL | Freq: Once | ORAL | Status: AC
  Administered 2012-12-26: 30 mL via ORAL
  Filled 2012-12-26: qty 30

## 2012-12-26 NOTE — MAU Note (Signed)
Patient states was advice by office to come to MAU for chest pain that has lasted 3 days unrelieved by tums or zantac. Denies pain radiating to neck or arm, diaphoretic, headache, or dizziness. States feels short of breath intermittently. Patient c/o of pain when taking a deep breath. Denies bleeding, LOF, or contractions. States feeling good fetal movement.

## 2013-03-25 ENCOUNTER — Inpatient Hospital Stay (HOSPITAL_COMMUNITY)
Admission: AD | Admit: 2013-03-25 | Discharge: 2013-03-25 | Disposition: A | Discharge status: Home / Self Care | Payer: BC Managed Care – PPO | Source: Ambulatory Visit | Attending: Obstetrics and Gynecology | Admitting: Obstetrics and Gynecology

## 2013-03-25 ENCOUNTER — Encounter (HOSPITAL_COMMUNITY): Payer: Self-pay | Admitting: *Deleted

## 2013-03-25 DIAGNOSIS — N949 Unspecified condition associated with female genital organs and menstrual cycle: Secondary | ICD-10-CM | POA: Insufficient documentation

## 2013-03-25 DIAGNOSIS — R109 Unspecified abdominal pain: Secondary | ICD-10-CM | POA: Insufficient documentation

## 2013-03-25 DIAGNOSIS — O9989 Other specified diseases and conditions complicating pregnancy, childbirth and the puerperium: Secondary | ICD-10-CM

## 2013-03-25 DIAGNOSIS — N898 Other specified noninflammatory disorders of vagina: Secondary | ICD-10-CM

## 2013-03-25 DIAGNOSIS — O99891 Other specified diseases and conditions complicating pregnancy: Secondary | ICD-10-CM | POA: Insufficient documentation

## 2013-03-25 LAB — URINALYSIS, ROUTINE W REFLEX MICROSCOPIC
Bilirubin Urine: NEGATIVE
Glucose, UA: NEGATIVE mg/dL
Hgb urine dipstick: NEGATIVE
Ketones, ur: NEGATIVE mg/dL
Leukocytes, UA: NEGATIVE
Nitrite: NEGATIVE
Protein, ur: NEGATIVE mg/dL
Specific Gravity, Urine: <1.005 — ABNORMAL LOW (ref 1.005–1.030)
Urobilinogen, UA: 0.2 mg/dL (ref 0.0–1.0)
pH: 6 (ref 5.0–8.0)

## 2013-03-25 NOTE — Progress Notes (Signed)
Pt states she has increased pain on movement

## 2013-03-25 NOTE — MAU Provider Note (Signed)
Chief Complaint:  Abdominal Pain and Rupture of Membranes   First Provider Initiated Contact with Patient 03/25/13 1549      HPI: Angela Vang is a 27 y.o. G4P0030 at [redacted]w[redacted]d who presents to maternity admissions reporting wetness in underwear enough to use a panty liner for the last 2 days. Today she put on a panty liner at 10:00am and found it to be soaked at 2:00pm, which is why she came in. Denies any gush of fluid. She has been having abdominal discomfort especially with fetal movement for some time but it has intensified over the past 2 days. When it occurs, the pain is constant and generalized throughout abdomen. It is worse with FM, not related to her position or movement. It resolves spontaneously for hours, then recurs.  Denies any vaginal irritation or itching. No antecedent intercourse. No dysuria, hematuria, frequency or urgency of urination. No stress urinary incontinence. No vaginal bleeding; good fetal movement. Denies contractions or vaginal bleeding. Good fetal movement.   Pregnancy Course: essentially uncomplicated  Past Medical History: Past Medical History  Diagnosis Date  . Depression   . Anxiety   . History of chickenpox   . Headache(784.0)     Past obstetric history: OB History  Gravida Para Term Preterm AB SAB TAB Ectopic Multiple Living  4 0   3 3        # Outcome Date GA Lbr Len/2nd Weight Sex Delivery Anes PTL Lv  4 CUR           3 SAB 08/2010    U    N     Comments: Patient's mother does not know there were three SABs  2 SAB           1 SAB             Obstetric Comments  2 miscarriages    Past Surgical History: Past Surgical History  Procedure Laterality Date  . Wisdom tooth extraction    . Dilation and curettage of uterus       Family History: Family History  Problem Relation Age of Onset  . Diabetes Mother   . Depression Father   . Bipolar disorder Father   . Leukemia Maternal Grandmother     Social History: History  Substance Use  Topics  . Smoking status: Never Smoker   . Smokeless tobacco: Former Neurosurgeon    Quit date: 09/17/2010  . Alcohol Use: No     Comment: Once a week    Allergies: No Known Allergies  Meds:  Prescriptions prior to admission  Medication Sig Dispense Refill  . Docosahexaenoic Acid (DHA PO) Take 1 tablet by mouth daily.      . Prenatal Vit-Fe Fumarate-FA (PRENATAL MULTIVITAMIN) TABS Take 1 tablet by mouth daily at 12 noon.      . [DISCONTINUED] famotidine (PEPCID) 20 MG tablet Take 1 tablet (20 mg total) by mouth 2 (two) times daily.  30 tablet  0    ROS: Pertinent findings in history of present illness.  Physical Exam  Last menstrual period 08/07/2012. GENERAL: Well-developed, well-nourished female in no acute distress.  HEENT: normocephalic HEART: normal rate RESP: normal effort ABDOMEN: Soft, non-tender, gravid appropriate for gestational age EXTREMITIES: Nontender, no edema NEURO: alert and oriented SPECULUM EXAM: NEFG, physiologic discharge, neg pooling, no blood, cervix clean  SVE: L/C/H    FHT:  Baseline 140-145 , moderate variability, accelerations present, no decelerations Contractions: none   Labs: Results for orders placed during the  hospital encounter of 03/25/13 (from the past 24 hour(s))  URINALYSIS, ROUTINE W REFLEX MICROSCOPIC     Status: Abnormal   Collection Time    03/25/13  4:08 PM      Result Value Range   Color, Urine YELLOW  YELLOW   APPearance CLEAR  CLEAR   Specific Gravity, Urine <1.005 (*) 1.005 - 1.030   pH 6.0  5.0 - 8.0   Glucose, UA NEGATIVE  NEGATIVE mg/dL   Hgb urine dipstick NEGATIVE  NEGATIVE   Bilirubin Urine NEGATIVE  NEGATIVE   Ketones, ur NEGATIVE  NEGATIVE mg/dL   Protein, ur NEGATIVE  NEGATIVE mg/dL   Urobilinogen, UA 0.2  0.0 - 1.0 mg/dL   Nitrite NEGATIVE  NEGATIVE   Leukocytes, UA NEGATIVE  NEGATIVE    Imaging:  Bedside US by me: normal AFV, cephalic  MAU Course: Fern negative  Assessment: 1. Vaginal discharge in  pregnancy in third trimester   No evidence SROM on exam or [place]   Plan: C/W Dr. Marcelle Overlie Discharge home. Return if recurs or if pain worsens. Labor precautions and fetal kick counts    Medication List    ASK your doctor about these medications       DHA PO  Take 1 tablet by mouth daily.     famotidine 20 MG tablet  Commonly known as:  PEPCID  Take 1 tablet (20 mg total) by mouth 2 (two) times daily.     prenatal multivitamin Tabs tablet  Take 1 tablet by mouth daily at 12 noon.       Work excuse given   Danae Orleans, CNM 03/25/2013 3:50 PM

## 2013-03-25 NOTE — MAU Note (Signed)
Will monitor pt for 15 additional minutes as per D.Poe CNM, and waiting for urinalysis results

## 2013-03-25 NOTE — MAU Note (Signed)
Pt states she felt fluid leaking about 1 hour ago. Pt states panty liner was completely soaked

## 2013-03-27 ENCOUNTER — Telehealth: Payer: Self-pay | Admitting: Cardiology

## 2013-03-27 ENCOUNTER — Ambulatory Visit (INDEPENDENT_AMBULATORY_CARE_PROVIDER_SITE_OTHER): Payer: BC Managed Care – PPO | Admitting: Cardiology

## 2013-03-27 ENCOUNTER — Encounter: Payer: Self-pay | Admitting: *Deleted

## 2013-03-27 ENCOUNTER — Encounter: Payer: Self-pay | Admitting: Cardiology

## 2013-03-27 VITALS — BP 108/68 | HR 85 | Ht 66.0 in | Wt 205.0 lb

## 2013-03-27 DIAGNOSIS — R0609 Other forms of dyspnea: Secondary | ICD-10-CM

## 2013-03-27 DIAGNOSIS — R002 Palpitations: Secondary | ICD-10-CM

## 2013-03-27 DIAGNOSIS — R0600 Dyspnea, unspecified: Secondary | ICD-10-CM

## 2013-03-27 HISTORY — DX: Palpitations: R00.2

## 2013-03-27 NOTE — Assessment & Plan Note (Signed)
Echocardiogram to assess LV function. 

## 2013-03-27 NOTE — Telephone Encounter (Signed)
New problem     Pt needs a Dr's note for 10/16 visit faxed to # [phone] attn;   [employer]

## 2013-03-27 NOTE — Patient Instructions (Signed)
Your physician recommends that you schedule a follow-up appointment in: 4 WEEKS WITH DR Jens Som  Your physician has requested that you have an echocardiogram. Echocardiography is a painless test that uses sound waves to create images of your heart. It provides your doctor with information about the size and shape of your heart and how well your heart's chambers and valves are working. This procedure takes approximately one hour. There are no restrictions for this procedure.   Your physician has recommended that you wear an event monitor. Event monitors are medical devices that record the heart's electrical activity. Doctors most often [place] these monitors to diagnose arrhythmias. Arrhythmias are problems with the speed or rhythm of the heartbeat. The monitor is a small, portable device. You can wear one while you do your normal daily activities. This is usually used to diagnose what is causing palpitations/syncope (passing out).

## 2013-03-27 NOTE — Assessment & Plan Note (Signed)
Question SVT. Plan CardioNet monitor.

## 2013-03-27 NOTE — Progress Notes (Signed)
     HPI: 59 are old female for evaluation of palpitations. No prior cardiac history. Note patient is [redacted] weeks pregnant. Over the past 2 months the patient has had episodes of palpitations described as her heart racing. These are not related to activities. They last up to 30 minutes. They resolve spontaneously. There is associated dizziness and dyspnea but she has not had syncope. There is no associated chest pain. She has some dyspnea on exertion and pedal edema in the latter stages of pregnancy. She has not had exertional chest pain. Cardiology is asked to evaluate.  Current Outpatient Prescriptions  Medication Sig Dispense Refill  . Docosahexaenoic Acid (DHA PO) Take 1 tablet by mouth daily.      . Prenatal Vit-Fe Fumarate-FA (PRENATAL MULTIVITAMIN) TABS Take 1 tablet by mouth daily at 12 noon.       No current facility-administered medications for this visit.    No Known Allergies  Past Medical History  Diagnosis Date  . Depression   . Anxiety   . History of chickenpox     Past Surgical History  Procedure Laterality Date  . Wisdom tooth extraction    . Dilation and curettage of uterus      History   Social History  . Marital Status: Married    Spouse Name: N/A    Number of Children: N/A  . Years of Education: N/A   Occupational History  . Not on file.   Social History Main Topics  . Smoking status: Never Smoker   . Smokeless tobacco: Former Neurosurgeon    Quit date: 09/17/2010  . Alcohol Use: No     Comment: Once a week  . Drug Use: No  . Sexual Activity: Yes   Other Topics Concern  . Not on file   Social History Narrative   hh of 2 live in  Boyfriend of 5 years and pet  dogs      ocass tobacco rare and ocass etoh    FA apparently stored and safe    Uses seat belts    5 30 - 2 am [place] air on phones   Preferred cust program   G2P0 2 first trimester losses.                   Family History  Problem Relation Age of Onset  . Diabetes Mother   . Depression  Father   . Bipolar disorder Father   . Leukemia Maternal Grandmother     ROS: no fevers or chills, productive cough, hemoptysis, dysphasia, odynophagia, melena, hematochezia, dysuria, hematuria, rash, seizure activity, orthopnea, PND, pedal edema, claudication. Remaining systems are negative.  Physical Exam:   Blood pressure 108/68, pulse 85, height 5\' 6"  (1.676 m), weight 205 lb (92.987 kg), last menstrual period 08/07/2012, SpO2 98.00%.  General:  Well developed/well nourished in NAD Skin warm/dry Patient not depressed No peripheral clubbing Back-normal HEENT-normal/normal eyelids Neck supple/normal carotid upstroke bilaterally; no bruits; no JVD; no thyromegaly chest - CTA/ normal expansion CV - RRR/normal S1 and S2; no murmurs, rubs or gallops;  PMI nondisplaced Abdomen -NT/ND, no HSM, + bowel sounds, no bruit, intrauterine pregnancy 2+ femoral pulses, no bruits Ext-no edema, chords, 2+ DP Neuro-grossly nonfocal  ECG sinus rhythm at a rate of 85. Nonspecific T-wave changes.

## 2013-03-27 NOTE — Telephone Encounter (Signed)
Note to the number provided.

## 2013-04-03 ENCOUNTER — Encounter: Payer: Self-pay | Admitting: *Deleted

## 2013-04-03 ENCOUNTER — Encounter (INDEPENDENT_AMBULATORY_CARE_PROVIDER_SITE_OTHER): Payer: BC Managed Care – PPO

## 2013-04-03 DIAGNOSIS — R0609 Other forms of dyspnea: Secondary | ICD-10-CM

## 2013-04-03 DIAGNOSIS — R002 Palpitations: Secondary | ICD-10-CM

## 2013-04-03 DIAGNOSIS — R0600 Dyspnea, unspecified: Secondary | ICD-10-CM

## 2013-04-03 NOTE — Progress Notes (Signed)
Patient ID: Angela Vang, female   DOB: 07-22-1985, 27 y.o.   MRN: [id number] [employer] 30 day cardiac event monitor applied to patient.

## 2013-04-11 ENCOUNTER — Ambulatory Visit (HOSPITAL_COMMUNITY): Payer: BC Managed Care – PPO | Attending: Cardiology | Admitting: Cardiology

## 2013-04-11 DIAGNOSIS — R0989 Other specified symptoms and signs involving the circulatory and respiratory systems: Secondary | ICD-10-CM | POA: Insufficient documentation

## 2013-04-11 DIAGNOSIS — R0609 Other forms of dyspnea: Secondary | ICD-10-CM | POA: Insufficient documentation

## 2013-04-11 DIAGNOSIS — R002 Palpitations: Secondary | ICD-10-CM

## 2013-04-11 DIAGNOSIS — R0600 Dyspnea, unspecified: Secondary | ICD-10-CM

## 2013-04-11 NOTE — Progress Notes (Signed)
Echo performed. 

## 2013-04-16 ENCOUNTER — Telehealth: Payer: Self-pay | Admitting: Cardiology

## 2013-04-16 NOTE — Telephone Encounter (Signed)
Spoke with pt, aware of echo results. 

## 2013-04-16 NOTE — Telephone Encounter (Signed)
New message ° ° ° °Returned Debra's call °

## 2013-05-02 ENCOUNTER — Encounter (HOSPITAL_COMMUNITY): Payer: Self-pay | Admitting: *Deleted

## 2013-05-02 ENCOUNTER — Inpatient Hospital Stay (HOSPITAL_COMMUNITY)
Admission: AD | Admit: 2013-05-02 | Discharge: 2013-05-02 | Disposition: A | Discharge status: Home / Self Care | Payer: BC Managed Care – PPO | Source: Ambulatory Visit | Attending: Obstetrics and Gynecology | Admitting: Obstetrics and Gynecology

## 2013-05-02 DIAGNOSIS — N949 Unspecified condition associated with female genital organs and menstrual cycle: Secondary | ICD-10-CM | POA: Insufficient documentation

## 2013-05-02 DIAGNOSIS — R109 Unspecified abdominal pain: Secondary | ICD-10-CM | POA: Insufficient documentation

## 2013-05-02 DIAGNOSIS — N938 Other specified abnormal uterine and vaginal bleeding: Secondary | ICD-10-CM | POA: Insufficient documentation

## 2013-05-02 NOTE — MAU Note (Signed)
PT SAYS SHE WAS IN OFFICE THIS AM - VE CLOSED-  DR MORRIS. THEN AT 5 PM SHE HAD  SMALL AMT BROWN  D/C ON PAD.   NOW IN  RM 1 -  1 VERY SMALL STREAK  OF BROWN  D/C.

## 2013-05-02 NOTE — MAU Note (Signed)
Had an exam earlier today in the office and the cervix was closed.

## 2013-05-02 NOTE — MAU Note (Signed)
Patient states she started bleeding about 45 minutes ago and had dark bleeding. States she is having some mild cramping. Reports good fetal movement. Denies watery discharge.

## 2013-05-05 ENCOUNTER — Ambulatory Visit: Payer: BC Managed Care – PPO | Admitting: Cardiology

## 2013-05-15 ENCOUNTER — Encounter (HOSPITAL_COMMUNITY): Payer: Self-pay | Admitting: *Deleted

## 2013-05-15 ENCOUNTER — Telehealth (HOSPITAL_COMMUNITY): Payer: Self-pay | Admitting: *Deleted

## 2013-05-15 LAB — OB RESULTS CONSOLE GBS: GBS: NEGATIVE

## 2013-05-15 NOTE — Telephone Encounter (Signed)
Preadmission screen  

## 2013-05-16 ENCOUNTER — Telehealth: Payer: Self-pay | Admitting: *Deleted

## 2013-05-16 NOTE — Telephone Encounter (Signed)
Left message for pt to call, monitor reviewed by dr Jens Som shows sinus with PVC

## 2013-05-21 ENCOUNTER — Encounter (HOSPITAL_COMMUNITY): Payer: Self-pay

## 2013-05-21 ENCOUNTER — Inpatient Hospital Stay (HOSPITAL_COMMUNITY)
Admission: RE | Admit: 2013-05-21 | Discharge: 2013-05-25 | DRG: 766 | Disposition: A | Discharge status: Home / Self Care | Payer: BC Managed Care – PPO | Source: Ambulatory Visit | Attending: Obstetrics and Gynecology | Admitting: Obstetrics and Gynecology

## 2013-05-21 VITALS — BP 122/74 | HR 83 | Temp 98.5°F | Resp 20 | Ht 66.0 in | Wt 211.0 lb

## 2013-05-21 DIAGNOSIS — O324XX0 Maternal care for high head at term, not applicable or unspecified: Secondary | ICD-10-CM | POA: Diagnosis present

## 2013-05-21 DIAGNOSIS — Z98891 History of uterine scar from previous surgery: Secondary | ICD-10-CM

## 2013-05-21 DIAGNOSIS — O480 Post-term pregnancy: Principal | ICD-10-CM | POA: Diagnosis present

## 2013-05-21 DIAGNOSIS — Z8759 Personal history of other complications of pregnancy, childbirth and the puerperium: Secondary | ICD-10-CM

## 2013-05-21 LAB — CBC
HCT: 34.3 % — ABNORMAL LOW (ref 36.0–46.0)
Hemoglobin: 11.9 g/dL — ABNORMAL LOW (ref 12.0–15.0)
MCH: 29.6 pg (ref 26.0–34.0)
MCHC: 34.7 g/dL (ref 30.0–36.0)
MCV: 85.3 fL (ref 78.0–100.0)
Platelets: 204 10*3/uL (ref 150–400)
RBC: 4.02 MIL/uL (ref 3.87–5.11)
RDW: 14.7 % (ref 11.5–15.5)
WBC: 14.2 10*3/uL — ABNORMAL HIGH (ref 4.0–10.5)

## 2013-05-21 MED ORDER — FLEET ENEMA 7-19 GM/118ML RE ENEM: 1.0000 | ENEMA | RECTAL | Status: DC | PRN

## 2013-05-21 MED ORDER — MISOPROSTOL 25 MCG QUARTER TABLET
25.0000 ug | ORAL_TABLET | ORAL | Status: DC | PRN
  Administered 2013-05-21 – 2013-05-22 (×2): 25 ug via VAGINAL
  Filled 2013-05-21 (×2): qty 0.25

## 2013-05-21 MED ORDER — TERBUTALINE SULFATE 1 MG/ML IJ SOLN: 0.2500 mg | Freq: Once | INTRAMUSCULAR | Status: AC | PRN

## 2013-05-21 MED ORDER — IBUPROFEN 600 MG PO TABS: 600.0000 mg | ORAL_TABLET | Freq: Four times a day (QID) | ORAL | Status: DC | PRN

## 2013-05-21 MED ORDER — LACTATED RINGERS IV SOLN: 500.0000 mL | INTRAVENOUS | Status: DC | PRN

## 2013-05-21 MED ORDER — LIDOCAINE HCL (PF) 1 % IJ SOLN: 30.0000 mL | INTRAMUSCULAR | Status: DC | PRN

## 2013-05-21 MED ORDER — OXYTOCIN BOLUS FROM INFUSION: 500.0000 mL | INTRAVENOUS | Status: DC

## 2013-05-21 MED ORDER — ACETAMINOPHEN 325 MG PO TABS: 650.0000 mg | ORAL_TABLET | ORAL | Status: DC | PRN

## 2013-05-21 MED ORDER — OXYTOCIN 40 UNITS IN LACTATED RINGERS INFUSION - SIMPLE MED: 62.5000 mL/h | INTRAVENOUS | Status: DC

## 2013-05-21 MED ORDER — ZOLPIDEM TARTRATE 5 MG PO TABS
5.0000 mg | ORAL_TABLET | Freq: Every evening | ORAL | Status: DC | PRN
Start: 2013-05-21 — End: 2013-05-23
  Administered 2013-05-22: 5 mg via ORAL
  Filled 2013-05-21: qty 1

## 2013-05-21 MED ORDER — LACTATED RINGERS IV SOLN
INTRAVENOUS | Status: DC
  Administered 2013-05-21 – 2013-05-22 (×9): via INTRAVENOUS

## 2013-05-21 MED ORDER — OXYCODONE-ACETAMINOPHEN 5-325 MG PO TABS: 1.0000 | ORAL_TABLET | ORAL | Status: DC | PRN

## 2013-05-21 MED ORDER — CITRIC ACID-SODIUM CITRATE 334-500 MG/5ML PO SOLN
30.0000 mL | ORAL | Status: DC | PRN
  Administered 2013-05-22: 30 mL via ORAL
  Filled 2013-05-21: qty 15

## 2013-05-21 MED ORDER — ONDANSETRON HCL 4 MG/2ML IJ SOLN: 4.0000 mg | Freq: Four times a day (QID) | INTRAMUSCULAR | Status: DC | PRN

## 2013-05-22 ENCOUNTER — Encounter (HOSPITAL_COMMUNITY): Payer: BC Managed Care – PPO | Admitting: Anesthesiology

## 2013-05-22 ENCOUNTER — Inpatient Hospital Stay (HOSPITAL_COMMUNITY): Payer: BC Managed Care – PPO | Admitting: Anesthesiology

## 2013-05-22 ENCOUNTER — Encounter (HOSPITAL_COMMUNITY)
Admission: RE | Disposition: A | Discharge status: Home / Self Care | Payer: Self-pay | Source: Ambulatory Visit | Attending: Obstetrics and Gynecology

## 2013-05-22 ENCOUNTER — Encounter (HOSPITAL_COMMUNITY): Payer: Self-pay

## 2013-05-22 LAB — RPR: RPR Ser Ql: NONREACTIVE

## 2013-05-22 SURGERY — Surgical Case
Anesthesia: Epidural | Site: Abdomen

## 2013-05-22 MED ORDER — ONDANSETRON HCL 4 MG/2ML IJ SOLN
INTRAMUSCULAR | Status: DC | PRN
  Administered 2013-05-22: 4 mg via INTRAVENOUS

## 2013-05-22 MED ORDER — MORPHINE SULFATE 0.5 MG/ML IJ SOLN
INTRAMUSCULAR | Status: AC
  Filled 2013-05-22: qty 10

## 2013-05-22 MED ORDER — SODIUM BICARBONATE 8.4 % IV SOLN
INTRAVENOUS | Status: DC | PRN
  Administered 2013-05-22: 5 mL via EPIDURAL
  Administered 2013-05-22: 2 mL via EPIDURAL
  Administered 2013-05-22: 5 mL via EPIDURAL
  Administered 2013-05-22: 3 mL via EPIDURAL
  Administered 2013-05-22: 5 mL via EPIDURAL

## 2013-05-22 MED ORDER — MEPERIDINE HCL 25 MG/ML IJ SOLN
INTRAMUSCULAR | Status: AC
  Filled 2013-05-22: qty 1

## 2013-05-22 MED ORDER — FENTANYL CITRATE 0.05 MG/ML IJ SOLN
25.0000 ug | INTRAMUSCULAR | Status: DC | PRN
  Administered 2013-05-22 (×2): 50 ug via INTRAVENOUS

## 2013-05-22 MED ORDER — LACTATED RINGERS IV SOLN
INTRAVENOUS | Status: DC | PRN
  Administered 2013-05-22: 22:00:00 via INTRAVENOUS

## 2013-05-22 MED ORDER — SCOPOLAMINE 1 MG/3DAYS TD PT72
1.0000 | MEDICATED_PATCH | Freq: Once | TRANSDERMAL | Status: DC
  Administered 2013-05-22: 1.5 mg via TRANSDERMAL

## 2013-05-22 MED ORDER — DIPHENHYDRAMINE HCL 50 MG/ML IJ SOLN
12.5000 mg | INTRAMUSCULAR | Status: DC | PRN
Start: 2013-05-22 — End: 2013-05-23

## 2013-05-22 MED ORDER — CEFAZOLIN SODIUM-DEXTROSE 2-3 GM-% IV SOLR
2.0000 g | Freq: Once | INTRAVENOUS | Status: AC
  Administered 2013-05-22: 2 g via INTRAVENOUS
  Filled 2013-05-22: qty 50

## 2013-05-22 MED ORDER — BUTORPHANOL TARTRATE 1 MG/ML IJ SOLN
1.0000 mg | Freq: Once | INTRAMUSCULAR | Status: AC
  Administered 2013-05-22: 1 mg via INTRAVENOUS
  Filled 2013-05-22: qty 1

## 2013-05-22 MED ORDER — ONDANSETRON HCL 4 MG/2ML IJ SOLN
INTRAMUSCULAR | Status: AC
  Filled 2013-05-22: qty 2

## 2013-05-22 MED ORDER — TERBUTALINE SULFATE 1 MG/ML IJ SOLN: 0.2500 mg | Freq: Once | INTRAMUSCULAR | Status: AC | PRN

## 2013-05-22 MED ORDER — PHENYLEPHRINE 40 MCG/ML (10ML) SYRINGE FOR IV PUSH (FOR BLOOD PRESSURE SUPPORT): 80.0000 ug | PREFILLED_SYRINGE | INTRAVENOUS | Status: DC | PRN

## 2013-05-22 MED ORDER — EPHEDRINE 5 MG/ML INJ: 10.0000 mg | INTRAVENOUS | Status: DC | PRN

## 2013-05-22 MED ORDER — SCOPOLAMINE 1 MG/3DAYS TD PT72
MEDICATED_PATCH | TRANSDERMAL | Status: AC
  Filled 2013-05-22: qty 1

## 2013-05-22 MED ORDER — SODIUM BICARBONATE 8.4 % IV SOLN
INTRAVENOUS | Status: AC
  Filled 2013-05-22: qty 50

## 2013-05-22 MED ORDER — EPHEDRINE 5 MG/ML INJ
10.0000 mg | INTRAVENOUS | Status: DC | PRN
  Filled 2013-05-22: qty 4

## 2013-05-22 MED ORDER — OXYTOCIN 40 UNITS IN LACTATED RINGERS INFUSION - SIMPLE MED
1.0000 m[IU]/min | INTRAVENOUS | Status: DC
  Administered 2013-05-22: 2 m[IU]/min via INTRAVENOUS
  Filled 2013-05-22: qty 1000

## 2013-05-22 MED ORDER — KETOROLAC TROMETHAMINE 30 MG/ML IJ SOLN: 30.0000 mg | Freq: Four times a day (QID) | INTRAMUSCULAR | Status: AC | PRN

## 2013-05-22 MED ORDER — 0.9 % SODIUM CHLORIDE (POUR BTL) OPTIME
TOPICAL | Status: DC | PRN
  Administered 2013-05-22: 1000 mL

## 2013-05-22 MED ORDER — KETOROLAC TROMETHAMINE 60 MG/2ML IM SOLN
60.0000 mg | Freq: Once | INTRAMUSCULAR | Status: AC | PRN
  Administered 2013-05-22: 60 mg via INTRAMUSCULAR

## 2013-05-22 MED ORDER — MORPHINE SULFATE (PF) 0.5 MG/ML IJ SOLN
INTRAMUSCULAR | Status: DC | PRN
  Administered 2013-05-22: 1 mg via INTRAVENOUS

## 2013-05-22 MED ORDER — LACTATED RINGERS IV SOLN
500.0000 mL | Freq: Once | INTRAVENOUS | Status: AC
  Administered 2013-05-22: 1000 mL via INTRAVENOUS

## 2013-05-22 MED ORDER — OXYTOCIN 10 UNIT/ML IJ SOLN
INTRAMUSCULAR | Status: AC
  Filled 2013-05-22: qty 4

## 2013-05-22 MED ORDER — LIDOCAINE HCL (PF) 1 % IJ SOLN
INTRAMUSCULAR | Status: DC | PRN
  Administered 2013-05-22 (×2): 4 mL

## 2013-05-22 MED ORDER — MEPERIDINE HCL 25 MG/ML IJ SOLN: 6.2500 mg | INTRAMUSCULAR | Status: DC | PRN

## 2013-05-22 MED ORDER — METOCLOPRAMIDE HCL 5 MG/ML IJ SOLN: 10.0000 mg | Freq: Once | INTRAMUSCULAR | Status: AC | PRN

## 2013-05-22 MED ORDER — FENTANYL CITRATE 0.05 MG/ML IJ SOLN
INTRAMUSCULAR | Status: AC
  Administered 2013-05-22: 50 ug via INTRAVENOUS
  Filled 2013-05-22: qty 2

## 2013-05-22 MED ORDER — LIDOCAINE-EPINEPHRINE (PF) 2 %-1:200000 IJ SOLN
INTRAMUSCULAR | Status: AC
  Filled 2013-05-22: qty 20

## 2013-05-22 MED ORDER — KETOROLAC TROMETHAMINE 60 MG/2ML IM SOLN
INTRAMUSCULAR | Status: AC
  Administered 2013-05-22: 60 mg via INTRAMUSCULAR
  Filled 2013-05-22: qty 2

## 2013-05-22 MED ORDER — OXYTOCIN 10 UNIT/ML IJ SOLN
40.0000 [IU] | INTRAVENOUS | Status: DC | PRN
  Administered 2013-05-22: 40 [IU] via INTRAVENOUS

## 2013-05-22 MED ORDER — FENTANYL 2.5 MCG/ML BUPIVACAINE 1/10 % EPIDURAL INFUSION (WH - ANES)
14.0000 mL/h | INTRAMUSCULAR | Status: DC | PRN
  Administered 2013-05-22: 14 mL/h via EPIDURAL
  Filled 2013-05-22 (×2): qty 125

## 2013-05-22 MED ORDER — MEPERIDINE HCL 25 MG/ML IJ SOLN
INTRAMUSCULAR | Status: DC | PRN
  Administered 2013-05-22 (×2): 12.5 mg via INTRAVENOUS

## 2013-05-22 MED ORDER — PROMETHAZINE HCL 25 MG/ML IJ SOLN
12.5000 mg | Freq: Once | INTRAMUSCULAR | Status: AC
  Administered 2013-05-22: 12.5 mg via INTRAVENOUS
  Filled 2013-05-22: qty 1

## 2013-05-22 MED ORDER — MORPHINE SULFATE (PF) 0.5 MG/ML IJ SOLN
INTRAMUSCULAR | Status: DC | PRN
  Administered 2013-05-22: 4 mg via EPIDURAL

## 2013-05-22 MED ORDER — PHENYLEPHRINE 40 MCG/ML (10ML) SYRINGE FOR IV PUSH (FOR BLOOD PRESSURE SUPPORT)
80.0000 ug | PREFILLED_SYRINGE | INTRAVENOUS | Status: DC | PRN
  Filled 2013-05-22: qty 10

## 2013-05-22 MED ORDER — FENTANYL 2.5 MCG/ML BUPIVACAINE 1/10 % EPIDURAL INFUSION (WH - ANES)
INTRAMUSCULAR | Status: DC | PRN
  Administered 2013-05-22: 14 mL/h via EPIDURAL

## 2013-05-22 SURGICAL SUPPLY — 32 items
ADH SKN CLS APL DERMABOND .7 (GAUZE/BANDAGES/DRESSINGS) ×1
CLAMP CORD UMBIL (MISCELLANEOUS) ×1 IMPLANT
CLOTH BEACON ORANGE TIMEOUT ST (SAFETY) ×2 IMPLANT
DERMABOND ADVANCED (GAUZE/BANDAGES/DRESSINGS) ×1
DERMABOND ADVANCED .7 DNX12 (GAUZE/BANDAGES/DRESSINGS) ×1 IMPLANT
DRAPE LG THREE QUARTER DISP (DRAPES) ×1 IMPLANT
DRSG OPSITE POSTOP 4X10 (GAUZE/BANDAGES/DRESSINGS) ×2 IMPLANT
DURAPREP 26ML APPLICATOR (WOUND CARE) ×2 IMPLANT
ELECT REM PT RETURN 9FT ADLT (ELECTROSURGICAL) ×2
ELECTRODE REM PT RTRN 9FT ADLT (ELECTROSURGICAL) ×1 IMPLANT
EXTRACTOR VACUUM M CUP 4 TUBE (SUCTIONS) IMPLANT
GLOVE BIO SURGEON STRL SZ 6.5 (GLOVE) ×2 IMPLANT
GLOVE BIOGEL PI IND STRL 7.0 (GLOVE) ×1 IMPLANT
GLOVE BIOGEL PI INDICATOR 7.0 (GLOVE) ×1
GOWN PREVENTION PLUS XLARGE (GOWN DISPOSABLE) ×4 IMPLANT
GOWN STRL REIN XL XLG (GOWN DISPOSABLE) ×4 IMPLANT
KIT ABG SYR 3ML LUER SLIP (SYRINGE) IMPLANT
NDL HYPO 25X5/8 SAFETYGLIDE (NEEDLE) ×1 IMPLANT
NEEDLE HYPO 25X5/8 SAFETYGLIDE (NEEDLE) ×2 IMPLANT
NS IRRIG 1000ML POUR BTL (IV SOLUTION) ×2 IMPLANT
PACK C SECTION WH (CUSTOM PROCEDURE TRAY) ×2 IMPLANT
PAD OB MATERNITY 4.3X12.25 (PERSONAL CARE ITEMS) ×2 IMPLANT
STAPLER VISISTAT 35W (STAPLE) IMPLANT
SUT CHROMIC 0 CT 802H (SUTURE) IMPLANT
SUT CHROMIC 0 CTX 36 (SUTURE) ×10 IMPLANT
SUT MON AB-0 CT1 36 (SUTURE) ×4 IMPLANT
SUT PDS AB 0 CTX 60 (SUTURE) ×2 IMPLANT
SUT PLAIN 0 NONE (SUTURE) IMPLANT
SUT VIC AB 4-0 KS 27 (SUTURE) ×1 IMPLANT
TOWEL OR 17X24 6PK STRL BLUE (TOWEL DISPOSABLE) ×2 IMPLANT
TRAY FOLEY CATH 14FR (SET/KITS/TRAYS/PACK) IMPLANT
WATER STERILE IRR 1000ML POUR (IV SOLUTION) ×1 IMPLANT

## 2013-05-22 NOTE — H&P (Signed)
Angela Vang is a 27 y.o. female presenting for postdates IOL.  No ctx, vb or lof.    History OB History   Grav Para Term Preterm Abortions TAB SAB Ect Mult Living   3 0   2  2        Obstetric Comments   2 miscarriages     Past Medical History  Diagnosis Date  . Depression   . Anxiety   . History of chickenpox   . Herpes     cold sores no vag lesions  . Hx of varicella   . Hx of gonorrhea     age 53  . Hx of anorexia nervosa    Past Surgical History  Procedure Laterality Date  . Wisdom tooth extraction    . Dilation and curettage of uterus     Family History: family history includes Bipolar disorder in her father; Cancer in her maternal grandmother; Depression in her father; Diabetes in her mother; Hypertension in her mother; Leukemia in her maternal grandmother; Mental illness in her brother and paternal grandfather. Social History:  reports that she quit smoking about 8 months ago. She quit smokeless tobacco use about 2 years ago. She reports that she does not drink alcohol or use illicit drugs.   Prenatal Transfer Tool  Maternal Diabetes: No Genetic Screening: Declined Maternal Ultrasounds/Referrals: Normal Fetal Ultrasounds or other Referrals:  None Maternal Substance Abuse:  No Significant Maternal Medications:  None Significant Maternal Lab Results:  None Other Comments:  None  ROS  Dilation: 1.5 Effacement (%): 80 Station: -3 Exam by:: Enis Slipper, RN Blood pressure 121/73, pulse 70, temperature 98.4 F (36.9 C), temperature source Oral, resp. rate 20, height 5\' 6"  (1.676 m), weight 95.709 kg (211 lb), last menstrual period 08/07/2012, SpO2 98.00%. Exam Physical Exam  Gen - comfortable w/ epidural now Abd - gravid, NT Ext - NT Cvx 1-2cm per rn exam  Prenatal labs: ABO, Rh: A/Positive/-- (04/25 0000) Antibody: Negative (04/25 0000) Rubella: Immune (04/25 0000) RPR: NON REACTIVE (12/10 2040)  HBsAg: Negative (04/25 0000)  HIV: Non-reactive  (04/25 0000)  GBS: Negative (12/04 0000)   Assessment/Plan: Admit S/p cytotec x 2 overnight SROM approx 4am - now on pitocin epidural  Reya Aurich 05/22/2013, 9:51 AM

## 2013-05-22 NOTE — Transfer of Care (Signed)
Immediate Anesthesia Transfer of Care Note  Patient: Angela Vang  Procedure(s) Performed: Procedure(s): Primary Cesarean Section Delivery Baby Girl @ 2137, Apgars 8/9 (N/A)  Patient Location: PACU  Anesthesia Type:Epidural  Level of Consciousness: awake  Airway & Oxygen Therapy: Patient Spontanous Breathing  Post-op Assessment: Report given to PACU RN and Post -op Vital signs reviewed and stable  Post vital signs: stable  Complications: No apparent anesthesia complications

## 2013-05-22 NOTE — Anesthesia Preprocedure Evaluation (Addendum)
Anesthesia Evaluation  Patient identified by MRN, date of birth, ID band Patient awake    Reviewed: Allergy & Precautions, H&P , Patient's Chart, lab work & pertinent test results  Airway Mallampati: III TM Distance: >3 FB Neck ROM: full    Dental no notable dental hx. (+) Teeth Intact   Pulmonary shortness of breath, former smoker,  breath sounds clear to auscultation  Pulmonary exam normal       Cardiovascular negative cardio ROS  Rhythm:regular Rate:Normal     Neuro/Psych PSYCHIATRIC DISORDERS Anxiety Depression ADHD Hx/o Anorexia nervosanegative neurological ROS     GI/Hepatic negative GI ROS, Neg liver ROS,   Endo/Other  Obesity  Renal/GU negative Renal ROS  negative genitourinary   Musculoskeletal negative musculoskeletal ROS (+)   Abdominal Normal abdominal exam  (+)   Peds  Hematology negative hematology ROS (+)   Anesthesia Other Findings   Reproductive/Obstetrics (+) Pregnancy (failure to descend --> C/S) HSV Hx/o gonorrhea                         Anesthesia Physical Anesthesia Plan  ASA: II and emergent  Anesthesia Plan: Epidural   Post-op Pain Management:    Induction:   Airway Management Planned:   Additional Equipment:   Intra-op Plan:   Post-operative Plan:   Informed Consent: I have reviewed the patients History and Physical, chart, labs and discussed the procedure including the risks, benefits and alternatives for the proposed anesthesia with the patient or authorized representative who has indicated his/her understanding and acceptance.     Plan Discussed with: Anesthesiologist, Surgeon and CRNA  Anesthesia Plan Comments:        Anesthesia Quick Evaluation

## 2013-05-22 NOTE — Consult Note (Signed)
Neonatology Note:   Attendance at C-section:    I was asked by Dr. Renaldo Fiddler to attend this primary C/S at term due to failure of descent. The mother is a G3P0A2 A pos, GBS neg with a history of anxiety and depression. ROM 17 hours prior to delivery, fluid with moderate meconium at that time, but clear at delivery. Infant vigorous with good spontaneous cry and tone. Had slightly prolonged capillary refill in first 1-2 minutes, but this resolved by 4 minutes. Needed only minimal bulb suctioning. Ap 8/9. Lungs clear to ausc in DR. To CN to care of Pediatrician.   Doretha Sou, MD

## 2013-05-22 NOTE — Progress Notes (Signed)
Pt pushed 45 min longer with very little descent.  Pt requesting c-section & declines to continue pushing.    FHT reassuring toco Q204 Cvx 0 - +1 station  A/P:  R/b/a of c-section discussed with pt including risk of infection, bleeding, damage to surrounding organs requiring further surgery.  All questions answered, informed consent

## 2013-05-22 NOTE — Progress Notes (Signed)
Pt examined approx 530pm and cvx 9/C/0 station, progressed to complete and started pushing approx 1 hr ago.  Pt now pushing with good effort x 1 hr, position LOA and 0 - +1 station.  Will reassess 20-60min

## 2013-05-22 NOTE — Progress Notes (Signed)
Dr Renaldo Fiddler notified regarding MVU's 295 with FHR 145 minimal to moderate variability with ?early/late decelerations. Order to decrease Pitocin to 44milliunits/min and continue to watch FHR. If no improvement to notify her. Will continue to monitor.

## 2013-05-22 NOTE — Progress Notes (Signed)
Pt comfortable w/ epidural.    FHT reassuring w/ accels Toco difficult to monitor ?Q3-4 Cvx 2/70/-2, vtx SROM forebag, IUPC placed  A/P:  Continue pitocin

## 2013-05-22 NOTE — Anesthesia Procedure Notes (Signed)
Epidural Patient location during procedure: OB Start time: 05/22/2013 9:02 AM  Staffing Anesthesiologist: Alonza Knisley A. Performed by: anesthesiologist   Preanesthetic Checklist Completed: patient identified, site marked, surgical consent, pre-op evaluation, timeout performed, IV checked, risks and benefits discussed and monitors and equipment checked  Epidural Patient position: sitting Prep: site prepped and draped and DuraPrep Patient monitoring: continuous pulse ox and blood pressure Approach: midline Injection technique: LOR air  Needle:  Needle type: Tuohy  Needle gauge: 17 G Needle length: 9 cm and 9 Needle insertion depth: 5 cm cm Catheter type: closed end flexible Catheter size: 19 Gauge Catheter at skin depth: 10 cm Test dose: negative and Other  Assessment Events: blood not aspirated, injection not painful, no injection resistance, negative IV test and no paresthesia  Additional Notes Patient identified. Risks and benefits discussed including failed block, incomplete  Pain control, post dural puncture headache, nerve damage, paralysis, blood pressure Changes, nausea, vomiting, reactions to medications-both toxic and allergic and post Partum back pain. All questions were answered. Patient expressed understanding and wished to proceed. Sterile technique was used throughout procedure. Epidural site was Dressed with sterile barrier dressing. No paresthesias, signs of intravascular injection Or signs of intrathecal spread were encountered.  Patient was more comfortable after the epidural was dosed. Please see RN's note for documentation of vital signs and FHR which are stable.

## 2013-05-23 DIAGNOSIS — Z98891 History of uterine scar from previous surgery: Secondary | ICD-10-CM

## 2013-05-23 LAB — CBC
HCT: 27.8 % — ABNORMAL LOW (ref 36.0–46.0)
Hemoglobin: 9.6 g/dL — ABNORMAL LOW (ref 12.0–15.0)
MCH: 29.5 pg (ref 26.0–34.0)
MCHC: 34.5 g/dL (ref 30.0–36.0)
MCV: 85.5 fL (ref 78.0–100.0)
Platelets: 182 10*3/uL (ref 150–400)
RBC: 3.25 MIL/uL — ABNORMAL LOW (ref 3.87–5.11)
RDW: 14.4 % (ref 11.5–15.5)
WBC: 22.5 10*3/uL — ABNORMAL HIGH (ref 4.0–10.5)

## 2013-05-23 MED ORDER — NALOXONE HCL 0.4 MG/ML IJ SOLN: 0.4000 mg | INTRAMUSCULAR | Status: DC | PRN

## 2013-05-23 MED ORDER — TETANUS-DIPHTH-ACELL PERTUSSIS 5-2.5-18.5 LF-MCG/0.5 IM SUSP: 0.5000 mL | Freq: Once | INTRAMUSCULAR | Status: DC

## 2013-05-23 MED ORDER — ONDANSETRON HCL 4 MG/2ML IJ SOLN: 4.0000 mg | INTRAMUSCULAR | Status: DC | PRN

## 2013-05-23 MED ORDER — MENTHOL 3 MG MT LOZG: 1.0000 | LOZENGE | OROMUCOSAL | Status: DC | PRN

## 2013-05-23 MED ORDER — ONDANSETRON HCL 4 MG PO TABS: 4.0000 mg | ORAL_TABLET | ORAL | Status: DC | PRN

## 2013-05-23 MED ORDER — DIPHENHYDRAMINE HCL 50 MG/ML IJ SOLN: 12.5000 mg | INTRAMUSCULAR | Status: DC | PRN

## 2013-05-23 MED ORDER — DIBUCAINE 1 % RE OINT: 1.0000 "application " | TOPICAL_OINTMENT | RECTAL | Status: DC | PRN

## 2013-05-23 MED ORDER — ONDANSETRON HCL 4 MG/2ML IJ SOLN: 4.0000 mg | Freq: Three times a day (TID) | INTRAMUSCULAR | Status: DC | PRN

## 2013-05-23 MED ORDER — DIPHENHYDRAMINE HCL 50 MG/ML IJ SOLN: 25.0000 mg | INTRAMUSCULAR | Status: DC | PRN

## 2013-05-23 MED ORDER — MEASLES, MUMPS & RUBELLA VAC ~~LOC~~ INJ
0.5000 mL | INJECTION | Freq: Once | SUBCUTANEOUS | Status: DC
  Filled 2013-05-23: qty 0.5

## 2013-05-23 MED ORDER — NALOXONE HCL 1 MG/ML IJ SOLN
1.0000 ug/kg/h | INTRAVENOUS | Status: DC | PRN
  Filled 2013-05-23: qty 2

## 2013-05-23 MED ORDER — IBUPROFEN 600 MG PO TABS
600.0000 mg | ORAL_TABLET | Freq: Four times a day (QID) | ORAL | Status: DC
  Administered 2013-05-23 – 2013-05-25 (×9): 600 mg via ORAL
  Filled 2013-05-23 (×9): qty 1

## 2013-05-23 MED ORDER — SIMETHICONE 80 MG PO CHEW
80.0000 mg | CHEWABLE_TABLET | Freq: Three times a day (TID) | ORAL | Status: DC
  Administered 2013-05-23 – 2013-05-25 (×7): 80 mg via ORAL
  Filled 2013-05-23 (×7): qty 1

## 2013-05-23 MED ORDER — NALBUPHINE SYRINGE 5 MG/0.5 ML
5.0000 mg | INJECTION | INTRAMUSCULAR | Status: DC | PRN
  Administered 2013-05-23: 5 mg via INTRAVENOUS
  Filled 2013-05-23 (×2): qty 1

## 2013-05-23 MED ORDER — SIMETHICONE 80 MG PO CHEW: 80.0000 mg | CHEWABLE_TABLET | ORAL | Status: DC | PRN

## 2013-05-23 MED ORDER — SODIUM CHLORIDE 0.9 % IJ SOLN: 3.0000 mL | INTRAMUSCULAR | Status: DC | PRN

## 2013-05-23 MED ORDER — PRENATAL MULTIVITAMIN CH
1.0000 | ORAL_TABLET | Freq: Every day | ORAL | Status: DC
  Administered 2013-05-23 – 2013-05-25 (×3): 1 via ORAL
  Filled 2013-05-23 (×3): qty 1

## 2013-05-23 MED ORDER — OXYCODONE-ACETAMINOPHEN 5-325 MG PO TABS
1.0000 | ORAL_TABLET | ORAL | Status: DC | PRN
  Administered 2013-05-23 – 2013-05-24 (×6): 1 via ORAL
  Administered 2013-05-24 – 2013-05-25 (×5): 2 via ORAL
  Filled 2013-05-23: qty 1
  Filled 2013-05-23 (×2): qty 2
  Filled 2013-05-23 (×3): qty 1
  Filled 2013-05-23 (×2): qty 2
  Filled 2013-05-23: qty 1
  Filled 2013-05-23: qty 2
  Filled 2013-05-23: qty 1

## 2013-05-23 MED ORDER — METOCLOPRAMIDE HCL 5 MG/ML IJ SOLN: 10.0000 mg | Freq: Three times a day (TID) | INTRAMUSCULAR | Status: DC | PRN

## 2013-05-23 MED ORDER — DEXTROSE IN LACTATED RINGERS 5 % IV SOLN
INTRAVENOUS | Status: DC
  Administered 2013-05-23: 1 mL via INTRAVENOUS

## 2013-05-23 MED ORDER — DIPHENHYDRAMINE HCL 25 MG PO CAPS
25.0000 mg | ORAL_CAPSULE | ORAL | Status: DC | PRN
  Administered 2013-05-24: 25 mg via ORAL
  Filled 2013-05-23 (×3): qty 1

## 2013-05-23 MED ORDER — NALBUPHINE SYRINGE 5 MG/0.5 ML
5.0000 mg | INJECTION | INTRAMUSCULAR | Status: DC | PRN
  Filled 2013-05-23: qty 1

## 2013-05-23 MED ORDER — DIPHENHYDRAMINE HCL 25 MG PO CAPS
25.0000 mg | ORAL_CAPSULE | Freq: Four times a day (QID) | ORAL | Status: DC | PRN
  Administered 2013-05-23: 25 mg via ORAL

## 2013-05-23 MED ORDER — WITCH HAZEL-GLYCERIN EX PADS: 1.0000 "application " | MEDICATED_PAD | CUTANEOUS | Status: DC | PRN

## 2013-05-23 MED ORDER — OXYTOCIN 40 UNITS IN LACTATED RINGERS INFUSION - SIMPLE MED: 62.5000 mL/h | INTRAVENOUS | Status: AC

## 2013-05-23 MED ORDER — SENNOSIDES-DOCUSATE SODIUM 8.6-50 MG PO TABS
2.0000 | ORAL_TABLET | ORAL | Status: DC
  Administered 2013-05-23 – 2013-05-25 (×3): 2 via ORAL
  Filled 2013-05-23 (×3): qty 2

## 2013-05-23 MED ORDER — MEDROXYPROGESTERONE ACETATE 150 MG/ML IM SUSP: 150.0000 mg | INTRAMUSCULAR | Status: DC | PRN

## 2013-05-23 MED ORDER — LANOLIN HYDROUS EX OINT: 1.0000 "application " | TOPICAL_OINTMENT | CUTANEOUS | Status: DC | PRN

## 2013-05-23 MED ORDER — SIMETHICONE 80 MG PO CHEW
80.0000 mg | CHEWABLE_TABLET | ORAL | Status: DC
  Administered 2013-05-23 – 2013-05-25 (×3): 80 mg via ORAL
  Filled 2013-05-23 (×3): qty 1

## 2013-05-23 NOTE — Anesthesia Postprocedure Evaluation (Signed)
  Anesthesia Post-op Note  Patient: Angela Vang  Procedure(s) Performed: Procedure(s): Primary Cesarean Section Delivery Baby Girl @ 2137, Apgars 8/9 (N/A)  Patient Location: Mother/Baby  Anesthesia Type:Epidural  Level of Consciousness: awake, alert  and oriented  Airway and Oxygen Therapy: Patient Spontanous Breathing  Post-op Pain: none  Post-op Assessment: Post-op Vital signs reviewed, Patient's Cardiovascular Status Stable, No headache, No backache, No residual numbness and No residual motor weakness  Post-op Vital Signs: Reviewed and stable  Complications: No apparent anesthesia complications

## 2013-05-23 NOTE — Progress Notes (Signed)
Subjective: Postpartum Day 1: Cesarean Delivery Patient reports tolerating PO.  Reports some persistent paraesthesia in R leg. Is able to ambulate  Objective: Vital signs in last 24 hours: Temp:  [97.3 F (36.3 C)-100.3 F (37.9 C)] 97.3 F (36.3 C) (12/12 0650) Pulse Rate:  [51-104] 76 (12/12 0650) Resp:  [16-24] 16 (12/12 0013) BP: (89-139)/(53-99) 99/66 mmHg (12/12 0650) SpO2:  [85 %-100 %] 96 % (12/12 0529)  Physical Exam:  General: alert and cooperative Lochia: appropriate Uterine Fundus: firm Incision: honeycomb dressing CDI DVT Evaluation: No evidence of DVT seen on physical exam. Negative Homan's sign. No cords or calf tenderness. No significant calf/ankle edema.   Recent Labs  05/21/13 2040 05/23/13 0605  HGB 11.9* 9.6*  HCT 34.3* 27.8*  + BS  Assessment/Plan: Status post Cesarean section. Doing well postoperatively.  Continue current care.  CURTIS,CAROL G 05/23/2013, 8:29 AM

## 2013-05-23 NOTE — Anesthesia Postprocedure Evaluation (Signed)
  Anesthesia Post-op Note  Anesthesia Post Note  Patient: Angela Vang  Procedure(s) Performed: Procedure(s) (LRB): Primary Cesarean Section Delivery Baby Girl @ 2137, Apgars 8/9 (N/A)  Anesthesia type: Epidural  Patient location: PACU  Post pain: Pain level controlled  Post assessment: Post-op Vital signs reviewed  Last Vitals:  Filed Vitals:   05/22/13 2330  BP: 109/87  Pulse: 85  Temp: 36.9 C  Resp: 18    Post vital signs: stable  Level of consciousness: awake  Complications: No apparent anesthesia complications

## 2013-05-24 MED ORDER — HYDROCORTISONE 1 % EX CREA
TOPICAL_CREAM | Freq: Two times a day (BID) | CUTANEOUS | Status: DC
  Administered 2013-05-24 – 2013-05-25 (×2): via TOPICAL
  Filled 2013-05-24: qty 28

## 2013-05-24 NOTE — Lactation Note (Signed)
This note was copied from the chart of Angela Vang. Lactation Consultation Note  Patient Name: Angela Vang Date: 05/24/2013 Reason for consult: Follow-up assessment;Breast/nipple pain;Difficult latch Mom has been formula and syringe feeding today due to sore nipples. Her left nipple is bruised, cracked with positional stripe. The right nipple is red, bruised. Mom reports she cannot latch to left breast due to tenderness. Mom's nipples are slightly erect, short nipple shaft. Mom has #24 nipple shield, changed to #20 for better fit. Demonstrated to Mom how to pre-load nipple shield with formula to help with latch. Baby latched easily and demonstrated a good rhythmic suck, few audible swallows. Baby nursed for 25 minutes, scant amount of clear colostrum in nipple shield, baby appeared satiated. Baby also had 5 ml of formula. Set up DEBP for Mom to use and advised to post pump every 3 hours for 15 minutes on preemie setting to encourage milk production and if unable to tolerate baby at the breast. Plan is to BF every 2-3 hours or whenever baby is hungry, keep baby active at the breast for 15-30 minutes, alternate breast each feeding, post pump while FOB gives supplement of 15-20 ml EBM or formula. Parents reports feeling good about plan. Ask for assist as needed.   Maternal Data    Feeding Feeding Type: Formula Length of feed: 25 min  LATCH Score/Interventions Latch: Grasps breast easily, tongue down, lips flanged, rhythmical sucking. (using #20 nipple shield) Intervention(s): Adjust position;Assist with latch;Breast massage;Breast compression  Audible Swallowing: A few with stimulation  Type of Nipple: Everted at rest and after stimulation (short nipple shaft)  Comfort (Breast/Nipple): Engorged, cracked, bleeding, large blisters, severe discomfort Problem noted: Cracked, bleeding, blisters, bruises Intervention(s): Double electric pump;Expressed breast milk to  nipple;Hand pump  Interventions (Mild/moderate discomfort): Comfort gels  Hold (Positioning): Assistance needed to correctly position infant at breast and maintain latch. Intervention(s): Breastfeeding basics reviewed;Support Pillows;Position options;Skin to skin  LATCH Score: 6  Lactation Tools Discussed/Used Tools: Nipple Shields;Pump;Comfort gels Nipple shield size: 20;24 Breast pump type: Double-Electric Breast Pump Pump Review: Setup, frequency, and cleaning;Milk Storage Initiated by:: KG Date initiated:: 05/24/13   Consult Status Consult Status: Follow-up Date: 05/25/13 Follow-up type: In-patient    Alfred Levins 05/24/2013, 9:14 PM

## 2013-05-24 NOTE — Progress Notes (Signed)
Subjective: Postpartum Day 2: Cesarean Delivery Patient reports incisional pain, tolerating PO, + flatus and no problems voiding.    Objective: Vital signs in last 24 hours: Temp:  [97.6 F (36.4 C)-98.6 F (37 C)] 98 F (36.7 C) (12/13 0626) Pulse Rate:  [74-80] 74 (12/13 0626) Resp:  [18-20] 18 (12/13 0626) BP: (96-113)/(64-74) 110/70 mmHg (12/13 0626) SpO2:  [95 %-98 %] 97 % (12/13 0626)  Physical Exam:  General: alert, cooperative, appears stated age and mild distress Lochia: appropriate Uterine Fundus: firm Incision: healing well DVT Evaluation: No evidence of DVT seen on physical exam.   Recent Labs  05/21/13 2040 05/23/13 0605  HGB 11.9* 9.6*  HCT 34.3* 27.8*    Assessment/Plan: Status post Cesarean section. Doing well postoperatively.  Continue current care.  Elan Brainerd C 05/24/2013, 9:33 AM

## 2013-05-25 ENCOUNTER — Inpatient Hospital Stay (HOSPITAL_COMMUNITY): Admission: RE | Admit: 2013-05-25 | Payer: BC Managed Care – PPO | Source: Ambulatory Visit

## 2013-05-25 MED ORDER — IBUPROFEN 600 MG PO TABS: 600.0000 mg | ORAL_TABLET | Freq: Four times a day (QID) | ORAL | Status: DC

## 2013-05-25 MED ORDER — HYDROCORTISONE 1 % EX CREA: TOPICAL_CREAM | Freq: Two times a day (BID) | CUTANEOUS | Status: DC

## 2013-05-25 MED ORDER — OXYCODONE-ACETAMINOPHEN 5-325 MG PO TABS: 1.0000 | ORAL_TABLET | ORAL | Status: DC | PRN

## 2013-05-25 NOTE — Discharge Summary (Signed)
Obstetric Discharge Summary Reason for Admission: induction of labor Prenatal Procedures: none Intrapartum Procedures: cesarean: low cervical, transverse Postpartum Procedures: none Complications-Operative and Postpartum: none Hemoglobin  Date Value Range Status  05/23/2013 9.6* 12.0 - 15.0 g/dL Final     DELTA CHECK NOTED     REPEATED TO VERIFY     HCT  Date Value Range Status  05/23/2013 27.8* 36.0 - 46.0 % Final    Physical Exam:  General: alert, cooperative, appears stated age and no distress Lochia: appropriate Uterine Fundus: firm Incision: healing well DVT Evaluation: No evidence of DVT seen on physical exam.  Discharge Diagnoses: Term Pregnancy-delivered  Discharge Information: Date: 05/25/2013 Activity: pelvic rest Diet: routine Medications: Ibuprofen, Percocet and hydrocortisone Condition: stable Instructions: refer to practice specific booklet Discharge to: home   Newborn Data: Live born female  Birth Weight: 7 lb 3 oz (3260 g) APGAR: 8, 9  Home with mother.  Elizer Bostic C 05/25/2013, 10:03 AM

## 2013-05-26 ENCOUNTER — Encounter (HOSPITAL_COMMUNITY): Payer: Self-pay | Admitting: Obstetrics and Gynecology

## 2013-05-28 ENCOUNTER — Ambulatory Visit: Payer: Self-pay

## 2013-05-28 NOTE — Lactation Note (Signed)
This note was copied from the chart of Chester Romero. Adult Lactation Consultation Outpatient Visit Note: Mom here today because of very sore nipples- scabs noted on both nipples. Mom's mature milk came in yesterday. She has Ameda pump at home and reports that nothing is coming out when she pumps and it is hurting. Has not emptied breasts since yesterday morning. Assisted mom with Medela  DEBP here. Pumped about 20 cc's from each breast. Mom had appointment for tomorrow but was able to get in today.   Patient Name: Lolah Coghlan Date of Birth: 05/22/2012 Gestational Age at Delivery: Unknown Type of Delivery:    Comments:    Consultation Evaluation:  Initial Feeding Assessment: Pre-feed Weight: 7-2.2  3236g Post-feed Weight: 7-2.7  3250g Amount Transferred:14 cc's Comments: Assisted mom with latch- she is using NS. Used football hold on left breast. Some swallows noted.  Additional Feeding Assessment: Pre-feed Weight: 7- 2.7  3250  Post-feed Weight: 7- 3.5  3276g Amount Transferred: 26 cc's Comments: Assisted mom with latch in football hold on right breast. Lots of swallows noted. This breast was the fullest one. Whitish milk noted in NS after nursing. Mom reports that breast feels softer after nursing. Ellie awake but content. . Mom pumped for 15 more min and obtained about 3  1/2 oz total.Reviewed milk storage with parents    Total Breast milk Transferred this Visit: 40 cc's Total Supplement Given: 0  Additional Interventions:  Reviewed engorgement prevention and treatment. Encouraged to nurse whenever she sees feeding cues and breast should soften. If breast is too full before nursing may need to prepump. If breast is not softer may need to post pump. Comfort gels given. Mom bought large Ameda flanges and hopefully that will help with the pain with pumping. No questions at present. To call prn Follow-Up  With Ped To call here prn    Pamelia Hoit 05/28/2013, 5:49  PM

## 2013-05-30 NOTE — Telephone Encounter (Signed)
Left message of results for pt  

## 2013-06-13 NOTE — Op Note (Signed)
Angela Vang:  Duzan, Vania              ACCOUNT NO.:  [id number]  MEDICAL RECORD NO.:  [id number]  LOCATION:  9126                          FACILITY:  [facility]  PHYSICIAN:  Zelphia CairoGretchen Brantley Wiley, MD    DATE OF BIRTH:  [birth year]  DATE OF PROCEDURE:  05/22/2013 DATE OF DISCHARGE:  05/25/2013                              OPERATIVE REPORT   PREOPERATIVE DIAGNOSIS:  Arrest of descent.  POSTOPERATIVE DIAGNOSIS:  Arrest of descent.  PROCEDURE:  Primary low transverse cesarean delivery.  SURGEON:  Zelphia CairoGretchen Charmine Bockrath, MD  ANESTHESIA:  Epidural.  COMPLICATIONS:  None.  CONDITION:  Stable to recovery room.  DESCRIPTION OF PROCEDURE:  The patient was taken to the operating room. After informed consent was obtained, she was given adequate anesthesia through her epidural.  She was prepped and draped, placed in the supine position with a left tilt.  She was prepped and draped in sterile fashion and Foley catheter was in place.  A Pfannenstiel skin incision was made with a scalpel and extended to the underlying fascia.  The fascia was incised in the midline and extended laterally using curved Mayo scissors.  Kocher clamps were used to grasp the superior inferior portion of the fascia.  The fascia was tented upwards, the underlying rectus muscles were dissected from the fascia, peritoneum was identified and entered sharply.  This was extended superiorly and inferiorly with good visualization of the bladder.  The bladder blade was inserted. Vesicouterine peritoneum was dissected off the lower uterine segment using sharp and blunt dissection.  Uterine incision was made with a scalpel and extended bluntly using my fingers.  Fetal vertex was brought through the uterine incision.  The mouth and nose were suctioned.  The shoulders and body followed using fundal pressure.  The cord was clamped and cut, and the infant was taken to the awaiting pediatric staff.  The placenta was then manually removed from the uterus.   All clots and debris were removed from the uterus.  The uterine incision was closed using double layer closure of 0 chromic.  Excellent hemostasis was noted.  The pelvis was then irrigated using saline.  The uterine incision was reinspected and again found to be hemostatic.  Peritoneum was closed with 0 Monocryl.  The fascia was closed with PDS and the skin was closed.  The patient tolerated the procedure well.  Sponge, lap, needle, and instrument counts were correct x2.  She was taken to the recovery room in stable condition __________.     Zelphia CairoGretchen Akaash Vandewater, MD     GA/MEDQ  D:  06/13/2013  T:  06/13/2013  Job:  [id number]

## 2013-10-13 ENCOUNTER — Encounter: Payer: Self-pay | Admitting: Internal Medicine

## 2013-10-13 ENCOUNTER — Ambulatory Visit (INDEPENDENT_AMBULATORY_CARE_PROVIDER_SITE_OTHER): Payer: BC Managed Care – PPO | Admitting: Internal Medicine

## 2013-10-13 VITALS — BP 130/78 | Temp 98.6°F | Ht 66.0 in | Wt 199.0 lb

## 2013-10-13 DIAGNOSIS — F909 Attention-deficit hyperactivity disorder, unspecified type: Secondary | ICD-10-CM

## 2013-10-13 MED ORDER — AMPHETAMINE-DEXTROAMPHET ER 10 MG PO CP24: 10.0000 mg | ORAL_CAPSULE | Freq: Every day | ORAL | Status: DC

## 2013-10-13 NOTE — Assessment & Plan Note (Signed)
No c/i to meds  palpitations  related to preeg and had neg echo underlying mood issues seem remitted  Plan reeval appt with dr Reggy EyeAltabet this week at Wednesday 4 pm.

## 2013-10-13 NOTE — Progress Notes (Signed)
Chief Complaint  Patient presents with  . Restart Adderall    HPI: Patient comes in for possible restarting Adderall and evaluate for ADHD. appt described as FU on medication but Last and only visit  was over 2 years ago . Did have a full-term pregnancy delivered by C-section in the fall. She now has a healthy 1933-month-old at home. c section. Asks about? Vaccines for her having a small infant at home she did have her T. death in her flu vaccine  .  ? Reevaluate issues. Regarding her ADHD that was diagnosed when she was about 9. 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  class training  Hard  To concentrate.  training for 14 weeks eels that she can do her job when she knows it without medication or extra help No depression anxiety issues now. Since her past pregnancy losses .  ocass etoh. . negative TD Is a Merchandiser, retailsupervisor.  Sleep  6-7 hours. .    ROS: See pertinent positives and negatives per HPI. Negative chest pain shortness of breath syncope. She was evaluated for palpitations and pregnancy felt to be benign PVCs with a normal echo test. Hasn't had any similar symptoms and lightheadedness since then.  Past Medical History  Diagnosis Date  . Depression   . Anxiety   . History of chickenpox   . Herpes     cold sores no vag lesions  . Hx of varicella   . Hx of gonorrhea     age 28  . Hx of anorexia nervosa   . Multiple pregnancy loss, not currently pregnant 08/05/2011    x 2 first trimester  sees dr Pennie RushingHaygood   . Palpitations 03/27/2013    Family History  Problem Relation Age of Onset  . Diabetes Mother   . Hypertension Mother   . Depression Father   . Bipolar disorder Father   . Leukemia Maternal Grandmother   . Cancer Maternal Grandmother      leaukemia  . Mental illness Brother     suicide att  . Mental illness Paternal Grandfather     suicide att    History   Social History  . Marital Status: Married    Spouse Name: N/A    Number of Children: N/A  . Years of Education: N/A   Social History Main Topics  . Smoking status: Former Smoker    Quit date: 09/13/2012  . Smokeless tobacco: Former NeurosurgeonUser    Quit date: 09/17/2010  . Alcohol Use: No     Comment: Once a week  . Drug Use: No  . Sexual Activity: Yes   Other Topics Concern  . 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      125 month old infant  To [facility].     HH   3 husband and infant 2 dogs and a  Cat  hh of  Outpatient Encounter Prescriptions as of 10/13/2013  Medication Sig  . JUNEL 1/20 1-20 MG-MCG tablet   . Prenatal Vit-Fe Fumarate-FA (PRENATAL MULTIVITAMIN) TABS Take 1 tablet by mouth daily at 12 noon.  Marland Kitchen. amphetamine-dextroamphetamine (ADDERALL XR) 10 MG 24 hr capsule Take 1 capsule (10 mg total) by mouth daily. Can increase to 2 po qd or as directed for adhd.  . [DISCONTINUED] acetaminophen (TYLENOL) 500 MG tablet Take 1,000 mg by mouth every 6 (six) hours as needed (back painj).  . [DISCONTINUED] Docosahexaenoic Acid (DHA PO) Take 1 tablet by mouth daily.  . [DISCONTINUED] ferrous sulfate 325 (65 FE) MG tablet Take 325 mg by mouth daily with breakfast.  . [DISCONTINUED] hydrocortisone cream 1 % Apply topically 2 (two) times daily.  . [DISCONTINUED] ibuprofen (ADVIL,MOTRIN) 600 MG tablet Take 1 tablet (600 mg total) by mouth every 6 (six) hours.  . [DISCONTINUED] oxyCODONE-acetaminophen (PERCOCET/ROXICET) 5-325 MG per tablet Take 1-2 tablets by mouth every 4 (four) hours as needed for severe pain (moderate - severe pain).    EXAM:  BP 130/78   Temp(Src) 98.6 F (37 C) (Oral)  Ht 5\' 6"  (1.676 m)  Wt 199 lb (90.266 kg)  BMI 32.13 kg/m2  Body mass index is 32.13 kg/(m^2).  GENERAL: vitals reviewed and listed above, alert, oriented, appears well hydrated and in no acute distress HEENT: atraumatic, conjunctiva  clear, no obvious abnormalities on inspection of external nose and ears OP : no lesion edema or exudate  NECK: no obvious masses on inspection palpation  LUNGS: clear to auscultation bilaterally, no wheezes, rales or rhonchi, good air movement CV: HRRR, no clubbing cyanosis or  peripheral edema nl cap refill  NEURO: oriented x 3 CN 3-12 appear intact. No focal muscle weakness or atrophy. DTRs symmetrical. Gait WNL.  Grossly non focal. No tremor or abnormal movement. MS: moves all extremities without noticeable focal  abnormality PSYCH: pleasant and cooperative,  No excess motor movement   ASSESSMENT AND PLAN:  Discussed the following assessment and plan:  ADHD (attention deficit hyperactivity disorder) - acceptable cand for meds and agree with reeval at this time low dose adderall and inc as appropriate.  Struggling with new job training class work for 14 weeks    Begin med options discussed and refer for reeval. Advise Pt vaccine utd  Flu vaccine in fall .  -Patient advised to return or notify health care team  if symptoms worsen ,persist or new concerns arise.  Patient Instructions  Begin low dose adderall xr as we discussed. Sign up for patient portal. Call in 1-2 weeks about how this is working  .  ROV in 1 month or as needed. Sign contract today.      Neta MendsWanda K. Sonika Levins M.D.  Pre visit review using our clinic review tool, if applicable. No additional management support is needed unless otherwise documented below in the visit note.

## 2013-10-13 NOTE — Patient Instructions (Signed)
Begin low dose adderall xr as we discussed. Sign up for patient portal. Call in 1-2 weeks about how this is working  .  ROV in 1 month or as needed. Sign contract today.

## 2013-10-15 ENCOUNTER — Ambulatory Visit (INDEPENDENT_AMBULATORY_CARE_PROVIDER_SITE_OTHER): Payer: 59 | Admitting: Psychology

## 2013-10-15 DIAGNOSIS — F988 Other specified behavioral and emotional disorders with onset usually occurring in childhood and adolescence: Secondary | ICD-10-CM

## 2013-10-22 ENCOUNTER — Telehealth: Payer: Self-pay | Admitting: Internal Medicine

## 2013-10-22 NOTE — Telephone Encounter (Signed)
Pt called to say adderall 20 mg helps a lot better than the 10 // time release capsule still does not last as long as you need it to . Is there such thing as a longer release   Pt said she only has 6 days left and will need a refill

## 2013-10-22 NOTE — Telephone Encounter (Signed)
Call patient and find out more. Can increase to 25 mg adderall xr and although not really longer acting may  Get more effect.  Lasting longer .   Otherwise we would have to change medication all together to something like vyvanse  A 10 - 12 hours effect .  See what she prefers and then plan follow up.  Did she  See the psychologist yet?

## 2013-10-23 MED ORDER — AMPHETAMINE-DEXTROAMPHET ER 25 MG PO CP24: 25.0000 mg | ORAL_CAPSULE | ORAL | Status: DC

## 2013-10-23 NOTE — Telephone Encounter (Signed)
Left a message at the below listed number for the pt to return my call. 

## 2013-10-23 NOTE — Telephone Encounter (Signed)
Spoke to the pt.  She it taking Adderall at 5:30 am and it is wearing off at 3.  She needs it to last until 8.  Will try this strength and will call back if not working.

## 2013-11-04 ENCOUNTER — Encounter: Payer: Self-pay | Admitting: Internal Medicine

## 2013-11-17 ENCOUNTER — Encounter: Payer: Self-pay | Admitting: Internal Medicine

## 2013-11-17 ENCOUNTER — Encounter: Payer: BC Managed Care – PPO | Admitting: Internal Medicine

## 2013-11-17 NOTE — Progress Notes (Signed)
Document opened and reviewed for OV but appt  canceled same day .  

## 2013-12-01 ENCOUNTER — Telehealth: Payer: Self-pay | Admitting: Internal Medicine

## 2013-12-01 NOTE — Telephone Encounter (Signed)
Spoke to the pt.  She stated she is doing well.  Has noticed some medication related side effects.  She has had a loss in appetite.  Has also noticed some nervous ticks such as pulling and rubbing her fingers.  Otherwise, states she is doing well and would like refills.  Advised that she was suppose to come back and see WP in one month.  Please advise refills.  Thanks!

## 2013-12-01 NOTE — Telephone Encounter (Signed)
Left a message for the pt to return my call.  

## 2013-12-01 NOTE — Telephone Encounter (Signed)
Schedule ov  Can rx 14 pills /daysuntil can come in

## 2013-12-01 NOTE — Telephone Encounter (Signed)
Pt req rx on amphetamine-dextroamphetamine (ADDERALL XR) 25 MG 24 hr capsule ° °

## 2013-12-02 MED ORDER — AMPHETAMINE-DEXTROAMPHET ER 25 MG PO CP24: 25.0000 mg | ORAL_CAPSULE | ORAL | Status: DC

## 2013-12-02 NOTE — Telephone Encounter (Signed)
Patient made follow up appt for 12/08/13.  Notified she may pick up at the front desk.

## 2013-12-02 NOTE — Telephone Encounter (Signed)
Left a message at the below listed number for the pt to return my call. 

## 2013-12-08 ENCOUNTER — Encounter: Payer: Self-pay | Admitting: Internal Medicine

## 2013-12-08 ENCOUNTER — Ambulatory Visit (INDEPENDENT_AMBULATORY_CARE_PROVIDER_SITE_OTHER): Payer: BC Managed Care – PPO | Admitting: Internal Medicine

## 2013-12-08 VITALS — BP 102/76 | HR 97 | Temp 98.9°F | Ht 66.0 in | Wt 188.5 lb

## 2013-12-08 DIAGNOSIS — F909 Attention-deficit hyperactivity disorder, unspecified type: Secondary | ICD-10-CM

## 2013-12-08 DIAGNOSIS — Z79899 Other long term (current) drug therapy: Secondary | ICD-10-CM

## 2013-12-08 MED ORDER — AMPHETAMINE-DEXTROAMPHET ER 25 MG PO CP24: 25.0000 mg | ORAL_CAPSULE | ORAL | Status: DC

## 2013-12-08 NOTE — Progress Notes (Signed)
Pre visit review using our clinic review tool, if applicable. No additional management support is needed unless otherwise documented below in the visit note. 

## 2013-12-08 NOTE — Progress Notes (Signed)
Chief Complaint  Patient presents with  . ADHD    med eval    HPI: Late fior fu of medication review fro probable add adhd  Had evaluation per dr Reggy Eyealtabet.  i remember reviewing the report but cant be seen in ehr  cw ongoing adhd sx   without sig psych underlying .  Ld not evaluated . Since that time she's use medication on a daily basis except for occasionally weekends without. She takes it at 5:30 good for her workday noticed a big improvement in focusing attention as well as her coworkers. No significant side effects except for some decrease in appetite. Denies any mood effects or major changes in her sleep. She tends to pick at her fingers when she worries that this before the medication when she was younger. No other major changes in her health status. ROS: See pertinent positives and negatives per HPI. No chest pain shortness of breath syncope at this time. She has a history of underlying anxiety and depressive reaction on low dose Zoloft.  Past Medical History  Diagnosis Date  . Depression   . Anxiety   . History of chickenpox   . Herpes     cold sores no vag lesions  . Hx of varicella   . Hx of gonorrhea     age 28  . Hx of anorexia nervosa   . Multiple pregnancy loss, not currently pregnant 08/05/2011    x 2 first trimester  sees dr Pennie RushingHaygood   . Palpitations 03/27/2013    Family History  Problem Relation Age of Onset  . Diabetes Mother   . Hypertension Mother   . Depression Father   . Bipolar disorder Father   . Leukemia Maternal Grandmother   . Cancer Maternal Grandmother     leaukemia  . Mental illness Brother     suicide att  . Mental illness Paternal Grandfather     suicide att    History   Social History  . Marital Status: Married    Spouse Name: N/A    Number of Children: N/A  . Years of Education: N/A   Social History Main Topics  . Smoking status: Former Smoker    Quit date: 09/13/2012  . Smokeless tobacco: Former NeurosurgeonUser    Quit date: 09/17/2010   . Alcohol Use: No     Comment: Once a week  . Drug Use: No  . Sexual Activity: Yes   Other Topics Concern  . 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      675 month old infant  To [facility].     HH   3 husband and infant 2 dogs and a  Cat  hh of           Outpatient Encounter Prescriptions as of 12/08/2013  Medication Sig  . amphetamine-dextroamphetamine (ADDERALL XR) 25 MG 24 hr capsule Take 1 capsule (25 mg total) by mouth every morning.  . cetirizine (ZYRTEC) 10 MG tablet Take 10 mg by mouth daily.  Marland Kitchen. levonorgestrel-ethinyl estradiol (ALTAVERA) 0.15-30 MG-MCG tablet Take 1 tablet by mouth daily.  . Multiple Vitamin (MULTIVITAMIN) capsule Take 1 capsule by mouth daily.  . sertraline (ZOLOFT) 25 MG tablet Take 25  mg by mouth daily.  . [DISCONTINUED] amphetamine-dextroamphetamine (ADDERALL XR) 25 MG 24 hr capsule Take 1 capsule (25 mg total) by mouth every morning.  . [DISCONTINUED] levonorgestrel-ethinyl estradiol (NORDETTE) 0.15-30 MG-MCG tablet Take 1 tablet by mouth daily.  Marland Kitchen. amphetamine-dextroamphetamine (ADDERALL XR) 25 MG 24 hr capsule Take 1 capsule (25 mg total) by mouth every morning.  Marland Kitchen. amphetamine-dextroamphetamine (ADDERALL XR) 25 MG 24 hr capsule Take 1 capsule (25 mg total) by mouth every morning.  . [DISCONTINUED] JUNEL 1/20 1-20 MG-MCG tablet   . [DISCONTINUED] Prenatal Vit-Fe Fumarate-FA (PRENATAL MULTIVITAMIN) TABS Take 1 tablet by mouth daily at 12 noon.    EXAM:  BP 102/76  Pulse 97  Temp(Src) 98.9 F (37.2 C) (Oral)  Ht 5\' 6"  (1.676 m)  Wt 188 lb 8 oz (85.503 kg)  BMI 30.44 kg/m2  Body mass index is 30.44 kg/(m^2).  GENERAL: vitals reviewed and listed above, alert, oriented, appears well hydrated and in no acute distress looks well oriented nontoxic normal thought  and speech well-groomed normal I contact HEENT: atraumatic, conjunctiva  clear, no obvious abnormalities on inspection of external nose and ears OP : no lesion edema or exudate  NECK: no obvious masses on inspection palpation  LUNGS: clear to auscultation bilaterally, no wheezes, rales or rhonchi, good air movement CV: HRRR, no clubbing cyanosis or  peripheral edema nl cap refill  Abdomen soft without or chemically guarding or rebound PSYCH: pleasant and cooperative, no obvious depression or anxiety Neurologic appears nonfocal  ASSESSMENT AND PLAN:  Discussed the following assessment and plan:  Attention deficit hyperactivity disorder (ADHD), unspecified ADHD type - Evaluation Dr. Reggy EyeALtabet not in the record apparently good response to medication in her work situation.  Medication management Refill x 90 day rov   Before or at 6 months  -Patient advised to return or notify health care team  if symptoms worsen ,persist or new concerns arise.  Patient Instructions  Refill medication today . Contact us if getting side effects  Or concerns . ROV in  6 months or less for medication evaluation . Call for refills in 3 months       Neta MendsWanda K. Sherika Kubicki M.D.  Will make effort to find the psych report evaluation  And get in the ehr .

## 2013-12-08 NOTE — Patient Instructions (Signed)
Refill medication today . Contact us if getting side effects  Or concerns . ROV in  6 months or less for medication evaluation . Call for refills in 3 months

## 2013-12-10 ENCOUNTER — Telehealth: Payer: Self-pay | Admitting: Family Medicine

## 2013-12-10 DIAGNOSIS — F902 Attention-deficit hyperactivity disorder, combined type: Secondary | ICD-10-CM

## 2013-12-10 NOTE — Telephone Encounter (Signed)
Please have Dr. Mariane MastersAltabet's office send over the patient's last ADD evaluation for her record. Thanks!

## 2013-12-10 NOTE — Telephone Encounter (Signed)
Message copied by Nils FlackADKINS, Tatayana Beshears T on Wed Dec 10, 2013  1:02 PM ------      Message from: [facility] Inc[facility]OSH, WANDA K      Created: Mon Dec 08, 2013  5:52 PM      Regarding: need  adhd evalu for chart       Mcdonald Reiling please       Help get report eval from dr altabet   Into the ehr .            Thanks WP ------

## 2013-12-11 NOTE — Telephone Encounter (Signed)
Dr. Viviann SpareSteven Altabet [phone]

## 2013-12-11 NOTE — Telephone Encounter (Signed)
Misty dr.Steven Altabet is on Epic you [facility] and you can get information.The phone # is [phone].

## 2013-12-11 NOTE — Telephone Encounter (Signed)
Please give this dr.Altabet full name ,because I can't  Find a dr. By that name.

## 2013-12-17 NOTE — Telephone Encounter (Signed)
Medical records placed a call for results to be faxed.

## 2013-12-17 NOTE — Telephone Encounter (Signed)
reveiwed to be scanned in

## 2013-12-17 NOTE — Telephone Encounter (Signed)
Paper work received and placed on WP's desk. 

## 2013-12-21 NOTE — Assessment & Plan Note (Signed)
Benefit more than risk of stimulant medication.  At this time will follow .

## 2014-02-05 DIAGNOSIS — J309 Allergic rhinitis, unspecified: Secondary | ICD-10-CM | POA: Insufficient documentation

## 2014-02-05 DIAGNOSIS — N200 Calculus of kidney: Secondary | ICD-10-CM | POA: Insufficient documentation

## 2014-02-05 DIAGNOSIS — L0100 Impetigo, unspecified: Secondary | ICD-10-CM | POA: Insufficient documentation

## 2014-02-19 ENCOUNTER — Ambulatory Visit (INDEPENDENT_AMBULATORY_CARE_PROVIDER_SITE_OTHER): Payer: BC Managed Care – PPO | Admitting: Internal Medicine

## 2014-02-19 ENCOUNTER — Encounter: Payer: Self-pay | Admitting: Internal Medicine

## 2014-02-19 VITALS — BP 108/72 | Temp 98.4°F | Ht 66.0 in | Wt 184.0 lb

## 2014-02-19 DIAGNOSIS — Z23 Encounter for immunization: Secondary | ICD-10-CM

## 2014-02-19 DIAGNOSIS — F902 Attention-deficit hyperactivity disorder, combined type: Secondary | ICD-10-CM | POA: Insufficient documentation

## 2014-02-19 DIAGNOSIS — F909 Attention-deficit hyperactivity disorder, unspecified type: Secondary | ICD-10-CM

## 2014-02-19 DIAGNOSIS — L659 Nonscarring hair loss, unspecified: Secondary | ICD-10-CM | POA: Insufficient documentation

## 2014-02-19 DIAGNOSIS — L650 Telogen effluvium: Secondary | ICD-10-CM

## 2014-02-19 LAB — BASIC METABOLIC PANEL
BUN: 8 mg/dL (ref 6–23)
CO2: 25 mEq/L (ref 19–32)
Calcium: 9.6 mg/dL (ref 8.4–10.5)
Chloride: 105 mEq/L (ref 96–112)
Creatinine, Ser: 0.8 mg/dL (ref 0.4–1.2)
GFR: 89.79 mL/min (ref >60.00)
Glucose, Bld: 79 mg/dL (ref 70–99)
Potassium: 4.2 mEq/L (ref 3.5–5.1)
Sodium: 136 mEq/L (ref 135–145)

## 2014-02-19 LAB — HEPATIC FUNCTION PANEL
ALT: 34 U/L (ref 0–35)
AST: 24 U/L (ref 0–37)
Albumin: 3.8 g/dL (ref 3.5–5.2)
Alkaline Phosphatase: 46 U/L (ref 39–117)
Bilirubin, Direct: 0 mg/dL (ref 0.0–0.3)
Total Bilirubin: 0.6 mg/dL (ref 0.2–1.2)
Total Protein: 7.3 g/dL (ref 6.0–8.3)

## 2014-02-19 LAB — CBC WITH DIFFERENTIAL/PLATELET
Basophils Absolute: 0 10*3/uL (ref 0.0–0.1)
Basophils Relative: 0.5 % (ref 0.0–3.0)
Eosinophils Absolute: 0.1 10*3/uL (ref 0.0–0.7)
Eosinophils Relative: 1.4 % (ref 0.0–5.0)
HCT: 41.6 % (ref 36.0–46.0)
Hemoglobin: 13.9 g/dL (ref 12.0–15.0)
Lymphocytes Relative: 41.4 % (ref 12.0–46.0)
Lymphs Abs: 2.8 10*3/uL (ref 0.7–4.0)
MCHC: 33.4 g/dL (ref 30.0–36.0)
MCV: 85.9 fl (ref 78.0–100.0)
Monocytes Absolute: 0.4 10*3/uL (ref 0.1–1.0)
Monocytes Relative: 6.2 % (ref 3.0–12.0)
Neutro Abs: 3.5 10*3/uL (ref 1.4–7.7)
Neutrophils Relative %: 50.5 % (ref 43.0–77.0)
Platelets: 245 10*3/uL (ref 150.0–400.0)
RBC: 4.85 Mil/uL (ref 3.87–5.11)
RDW: 13.4 % (ref 11.5–15.5)
WBC: 6.9 10*3/uL (ref 4.0–10.5)

## 2014-02-19 LAB — T4, FREE: Free T4: 1.04 ng/dL (ref 0.60–1.60)

## 2014-02-19 LAB — TSH: TSH: 0.25 u[IU]/mL — ABNORMAL LOW (ref 0.35–4.50)

## 2014-02-19 LAB — T3, FREE: T3, Free: 2.9 pg/mL (ref 2.3–4.2)

## 2014-02-19 NOTE — Patient Instructions (Addendum)
This may be telogen  efluvian and  can be after hormonal changes child birth and poss from medications . For now continue on the adderall cause helps you  Although any med can cause this problem more likely to be related to hormonal changes  Should Phase in and phase out over 3 months or so . Checking thyroid blood sugar  Also  If  persistent or progressive we can get dermatology to see you for opinion.   Stool softener  Good idea to help smal  Tears .

## 2014-02-19 NOTE — Progress Notes (Signed)
Chief Complaint  Patient presents with  . Hair Loss    Ongoing for 9 months but getting worse.  Also has blood on the tissue paper when she wipes but has not noticed any blood in her stool.    HPI: Patient Angela Vang  comes in today for SDA for  new problem evaluation. Patient states that she is having ongoing hair loss that she describes is shedding large amounts but most recently become more severe. She had childbirth last December with no complications has noticed this problem for about 9 months but getting worse over the last 3 months. No unusual fevers new medicines except her Adderall. She is on birth control pills that she was going on about 6 weeks after delivery.  She believes that the Adderall is really quite helpful concentrating and getting through the day without significant side effects.  No recent depression or thyroid disease noted. Her last laboratory tests have been related to her delivery.  ROS: See pertinent positives and negatives per HPI. No current chest pain shortness of breath unusual rashes has had a few bumps on the posterior part of her scalp it can get sore but she doesn't have them now. Has had an occasional blood on the tissue paper after bowel movements with no blood in the stool feels sore she think she has a tear has had this intermittently no other GI concerns.  Past Medical History  Diagnosis Date  . Depression   . Anxiety   . History of chickenpox   . Herpes     cold sores no vag lesions  . Hx of varicella   . Hx of gonorrhea     age 28  . Hx of anorexia nervosa   . Multiple pregnancy loss, not currently pregnant 08/05/2011    x 2 first trimester  sees dr Pennie Rushing   . Palpitations 03/27/2013    Family History  Problem Relation Age of Onset  . Diabetes Mother   . Hypertension Mother   . Depression Father   . Bipolar disorder Father   . Leukemia Maternal Grandmother   . Cancer Maternal Grandmother     leaukemia  . Mental illness  Brother     suicide att  . Mental illness Paternal Grandfather     suicide att    History   Social History  . Marital Status: Married    Spouse Name: N/A    Number of Children: N/A  . Years of Education: N/A   Social History Main Topics  . Smoking status: Former Smoker    Quit date: 09/13/2012  . Smokeless tobacco: Former Neurosurgeon    Quit date: 09/17/2010  . Alcohol Use: No     Comment: Once a week  . Drug Use: No  . Sexual Activity: Yes   Other Topics Concern  . 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      19 month old infant  To [facility].     HH   3 husband and infant 2 dogs and a  Cat  hh of           Outpatient Encounter Prescriptions as of 02/19/2014  Medication Sig  . amphetamine-dextroamphetamine (ADDERALL XR)  25 MG 24 hr capsule Take 1 capsule (25 mg total) by mouth every morning.  Marland Kitchen amphetamine-dextroamphetamine (ADDERALL XR) 25 MG 24 hr capsule Take 1 capsule (25 mg total) by mouth every morning.  Marland Kitchen amphetamine-dextroamphetamine (ADDERALL XR) 25 MG 24 hr capsule Take 1 capsule (25 mg total) by mouth every morning.  . cetirizine (ZYRTEC) 10 MG tablet Take 10 mg by mouth daily.  Marland Kitchen levonorgestrel-ethinyl estradiol (ALTAVERA) 0.15-30 MG-MCG tablet Take 1 tablet by mouth daily.  . Multiple Vitamin (MULTIVITAMIN) capsule Take 1 capsule by mouth daily.  . [DISCONTINUED] sertraline (ZOLOFT) 25 MG tablet Take 25 mg by mouth daily.    EXAM:  BP 108/72  Temp(Src) 98.4 F (36.9 C) (Oral)  Ht  (1.676 m)  Wt 184 lb (83.462 kg)  BMI 29.71 kg/m2  Body mass index is 29.71 kg/(m^2).  GENERAL: vitals reviewed and listed above, alert, oriented, appears well hydrated and in no acute distress HEENT: atraumatic, conjunctiva  clear, no obvious abnormalities on inspection of external  nose and ears  NECK: no obvious masses on inspection palpation no obvious thyroid nodules. LUNGS: clear to auscultation bilaterally, no wheezes, rales or rhonchi, good air movement CV: HRRR, no clubbing cyanosis or  peripheral edema nl cap refill  Abdomen soft without organomegaly guarding or rebound Skin clear scalp with no scarring alopecia noted her specific acute rashes. MS: moves all extremities without noticeable focal  abnormality PSYCH: pleasant and cooperative, no obvious depression or anxiety  ASSESSMENT AND PLAN:  Discussed the following assessment and plan:  Hair loss - Plan: Basic metabolic panel, CBC with Differential, TSH, T4, free, T3, free, Hepatic function panel, ANA  Telogen hair loss - Plan: Basic metabolic panel, CBC with Differential, TSH, T4, free, T3, free, Hepatic function panel, ANA  Need for prophylactic vaccination and inoculation against influenza - Plan: Flu Vaccine QUAD 36+ mos PF IM (Fluarix Quad PF) Discussed possibilities for her symptoms most likely hormonal although a little late related to childbirth but could be related to birth control pills. Would not want to stop the Adderall at this point because the symptoms occurred before she was put on them. Although discussed with her that any medication could cause this. However the prognosis is good. At this time we'll get screening lab tests follow if continuous progression consider dermatology consult if needed otherwise med check in December.  -Patient advised to return or notify health care team  if symptoms worsen ,persist or new concerns arise.  Patient Instructions  This may be telogen  efluvian and  can be after hormonal changes child birth and poss from medications . For now continue on the adderall cause helps you  Although any med can cause this problem more likely to be related to hormonal changes  Should Phase in and phase out over 3 months or so . Checking thyroid blood sugar  Also  If   persistent or progressive we can get dermatology to see you for opinion.   Stool softener  Good idea to help smal  Tears .     Neta Mends. Panosh M.D.  Pre visit review using our clinic review tool, if applicable. No additional management support is needed unless otherwise documented below in the visit note.

## 2014-02-20 LAB — ANA: Anti Nuclear Antibody(ANA): NEGATIVE

## 2014-02-27 ENCOUNTER — Other Ambulatory Visit (INDEPENDENT_AMBULATORY_CARE_PROVIDER_SITE_OTHER): Payer: BC Managed Care – PPO

## 2014-02-27 ENCOUNTER — Other Ambulatory Visit: Payer: Self-pay | Admitting: Family Medicine

## 2014-02-27 DIAGNOSIS — R7989 Other specified abnormal findings of blood chemistry: Secondary | ICD-10-CM

## 2014-02-27 LAB — TSH: TSH: 1.06 u[IU]/mL (ref 0.35–4.50)

## 2014-02-27 LAB — T4, FREE: Free T4: 0.97 ng/dL (ref 0.60–1.60)

## 2014-03-06 ENCOUNTER — Ambulatory Visit (INDEPENDENT_AMBULATORY_CARE_PROVIDER_SITE_OTHER): Payer: BC Managed Care – PPO | Admitting: Family Medicine

## 2014-03-06 ENCOUNTER — Encounter: Payer: Self-pay | Admitting: Family Medicine

## 2014-03-06 VITALS — BP 112/82 | HR 88 | Temp 98.1°F | Ht 66.0 in | Wt 181.5 lb

## 2014-03-06 DIAGNOSIS — K649 Unspecified hemorrhoids: Secondary | ICD-10-CM

## 2014-03-06 DIAGNOSIS — K625 Hemorrhage of anus and rectum: Secondary | ICD-10-CM

## 2014-03-06 MED ORDER — HYDROCORTISONE 2.5 % RE CREA: 1.0000 "application " | TOPICAL_CREAM | Freq: Two times a day (BID) | RECTAL | Status: DC

## 2014-03-06 NOTE — Progress Notes (Addendum)
No chief complaint on file.   HPI:  Acute visit for BRBPR: -saw PCP for this on 02/19/2014 and stool softener advised -she reports she has BRPPR with bowel movements daily for 2 weeks, streaks on stool and TP and sometimes a few drops in toilet -she also has had some mild rectal pain -denies: fevers, changes in bowels, melena, mucus in stools, diarrhea, constipation, vaginal or urinary bleeding -she has chronic pain around her surgical scar from her csection but otherwise no abd pain and this is not changed  ROS: See pertinent positives and negatives per HPI.  Past Medical History  Diagnosis Date  . Depression   . Anxiety   . History of chickenpox   . Herpes     cold sores no vag lesions  . Hx of varicella   . Hx of gonorrhea     age 89  . Hx of anorexia nervosa   . Multiple pregnancy loss, not currently pregnant 08/05/2011    x 2 first trimester  sees dr Pennie Rushing   . Palpitations 03/27/2013    Past Surgical History  Procedure Laterality Date  . Wisdom tooth extraction    . Dilation and curettage of uterus    . Cesarean section N/A 05/22/2013    Procedure: Primary Cesarean Section Delivery Baby Girl @ 2137, Apgars 8/9;  Surgeon: Zelphia Cairo, MD;  Location: [facility];  Service: Obstetrics;  Laterality: N/A;    Family History  Problem Relation Age of Onset  . Diabetes Mother   . Hypertension Mother   . Depression Father   . Bipolar disorder Father   . Leukemia Maternal Grandmother   . Cancer Maternal Grandmother     leaukemia  . Mental illness Brother     suicide att  . Mental illness Paternal Grandfather     suicide att    History   Social History  . Marital Status: Married    Spouse Name: N/A    Number of Children: N/A  . Years of Education: N/A   Social History Main Topics  . Smoking status: Former Smoker    Quit date: 09/13/2012  . Smokeless tobacco: Former Neurosurgeon    Quit date: 09/17/2010  . Alcohol Use: No     Comment: Once a week  . Drug Use: No  .  Sexual Activity: Yes   Other Topics Concern  . 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      72 month old infant  To [facility].     HH   3 husband and infant 2 dogs and a  Cat  hh of           Current outpatient prescriptions:amphetamine-dextroamphetamine (ADDERALL XR) 25 MG 24 hr capsule, Take 1 capsule (25 mg total) by mouth every morning., Disp: 30 capsule, Rfl: 0;  cetirizine (ZYRTEC) 10 MG tablet, Take 10 mg by mouth daily., Disp: , Rfl: ;  levonorgestrel-ethinyl estradiol (ALTAVERA) 0.15-30 MG-MCG tablet, Take 1 tablet by mouth daily., Disp: , Rfl:  Prenatal Multivit-Min-Fe-FA (PRENATAL VITAMINS PO), Take by mouth daily., Disp: , Rfl: ;  hydrocortisone (ANUSOL-HC) 2.5 % rectal cream, Place 1 application rectally 2 (two) times daily., Disp: 30 g, Rfl: 0  EXAM:  Filed  Vitals:   03/06/14 1601  BP: 112/82  Pulse: 88  Temp: 98.1 F (36.7 C)    Body mass index is 29.31 kg/(m^2).  GENERAL: vitals reviewed and listed above, alert, oriented, appears well hydrated and in no acute distress  HEENT: atraumatic, conjunttiva clear, no obvious abnormalities on inspection of external nose and ears  NECK: no obvious masses on inspection  LUNGS: clear to auscultation bilaterally, no wheezes, rales or rhonchi, good air movement  CV: HRRR, no peripheral edema  ABD: soft, BS+, NTTP  RECTAL/ANOSCOPIC EXAM: small purplish colored soft swelling at 12 oclock with small amount of bright red blood on gentle exam  MS: moves all extremities without noticeable abnormality  PSYCH: pleasant and cooperative, no obvious depression or anxiety  ASSESSMENT AND PLAN:  Discussed the following assessment and plan:  BRBPR (bright red blood per rectum) - Plan: hydrocortisone (ANUSOL-HC) 2.5 % rectal cream, CBC (no  diff)  Hemorrhoids, unspecified hemorrhoid type - Plan: hydrocortisone (ANUSOL-HC) 2.5 % rectal cream  -we discussed possible serious and likely etiologies, workup and treatment, treatment risks and return precautions - suspect external, non-thrombosed hemorrhoid -after this discussion, Angela Vang opted for stat CBC, topical tx of hemorrhoid, emergency and urgent care recs - GI or surgeon if persists  -follow up advised next week with PCP -of course, we advised Angela Vang  to return or notify a doctor immediately if symptoms worsen or persist or new concerns arise.   -Patient advised to return or notify a doctor immediately if symptoms worsen or persist or new concerns arise.  Patient Instructions  Hemorrhoids Hemorrhoids are swollen veins around the rectum or anus. There are two types of hemorrhoids:   Internal hemorrhoids. These occur in the veins just inside the rectum. They may poke through to the outside and become irritated and painful.  External hemorrhoids. These occur in the veins outside the anus and can be felt as a painful swelling or hard lump near the anus. CAUSES  Pregnancy.   Obesity.   Constipation or diarrhea.   Straining to have a bowel movement.   Sitting for long periods on the toilet.  Heavy lifting or other activity that caused you to strain.  Anal intercourse. SYMPTOMS   Pain.   Anal itching or irritation.   Rectal bleeding.   Fecal leakage.   Anal swelling.   One or more lumps around the anus.  DIAGNOSIS  Your caregiver may be able to diagnose hemorrhoids by visual examination. Other examinations or tests that may be performed include:   Examination of the rectal area with a gloved hand (digital rectal exam).   Examination of anal canal using a small tube (scope).   A blood test if you have lost a significant amount of blood.  A test to look inside the colon (sigmoidoscopy or colonoscopy). TREATMENT Most hemorrhoids can be treated  at home. However, if symptoms do not seem to be getting better or if you have a lot of rectal bleeding, your caregiver may perform a procedure to help make the hemorrhoids get smaller or remove them completely. Possible treatments include:   Placing a rubber band at the base of the hemorrhoid to cut off the circulation (rubber band ligation).   Injecting a chemical to shrink the hemorrhoid (sclerotherapy).   Using a tool to burn the hemorrhoid (infrared light therapy).   Surgically removing the hemorrhoid (hemorrhoidectomy).   Stapling the hemorrhoid to block blood flow to the tissue (hemorrhoid stapling).  HOME CARE INSTRUCTIONS   Eat  foods with fiber, such as whole grains, beans, nuts, fruits, and vegetables. Ask your doctor about taking products with added fiber in them (fibersupplements).  Increase fluid intake. Drink enough water and fluids to keep your urine clear or pale yellow.   Exercise regularly.   Go to the bathroom when you have the urge to have a bowel movement. Do not wait.   Avoid straining to have bowel movements.   Keep the anal area dry and clean. Use wet toilet paper or moist towelettes after a bowel movement.   Medicated creams and suppositories may be used or applied as directed.   Only take over-the-counter or prescription medicines as directed by your caregiver.   Take warm sitz baths for 15-20 minutes, 3-4 times a day to ease pain and discomfort.   Place ice packs on the hemorrhoids if they are tender and swollen. Using ice packs between sitz baths may be helpful.   Put ice in a plastic bag.   Place a towel between your skin and the bag.   Leave the ice on for 15-20 minutes, 3-4 times a day.   Do not use a donut-shaped pillow or sit on the toilet for long periods. This increases blood pooling and pain.  SEEK MEDICAL CARE IF:  You have increasing pain and swelling that is not controlled by treatment or medicine.  You have  uncontrolled bleeding.  You have difficulty or you are unable to have a bowel movement.  You have pain or inflammation outside the area of the hemorrhoids. MAKE SURE YOU:  Understand these instructions.  Will watch your condition.  Will get help right away if you are not doing well or get worse. Document Released: 05/26/2000 Document Revised: 05/15/2012 Document Reviewed: 04/02/2012 [facility] Patient Information 2015 [place], [place]. This information is not intended to replace advice given to you by your health care provider. Make sure you discuss any questions you have with your health care provider.      Kriste Basque R.

## 2014-03-06 NOTE — Patient Instructions (Signed)

## 2014-03-06 NOTE — Progress Notes (Signed)
Pre visit review using our clinic review tool, if applicable. No additional management support is needed unless otherwise documented below in the visit note. 

## 2014-03-07 LAB — CBC WITH DIFFERENTIAL/PLATELET
Basophils Absolute: 0.1 10*3/uL (ref 0.0–0.1)
Basophils Relative: 1 % (ref 0–1)
Eosinophils Absolute: 0.1 10*3/uL (ref 0.0–0.7)
Eosinophils Relative: 1 % (ref 0–5)
HCT: 39 % (ref 36.0–46.0)
Hemoglobin: 13.4 g/dL (ref 12.0–15.0)
Lymphocytes Relative: 52 % — ABNORMAL HIGH (ref 12–46)
Lymphs Abs: 4.3 10*3/uL — ABNORMAL HIGH (ref 0.7–4.0)
MCH: 28.3 pg (ref 26.0–34.0)
MCHC: 34.4 g/dL (ref 30.0–36.0)
MCV: 82.3 fL (ref 78.0–100.0)
Monocytes Absolute: 0.4 10*3/uL (ref 0.1–1.0)
Monocytes Relative: 5 % (ref 3–12)
Neutro Abs: 3.4 10*3/uL (ref 1.7–7.7)
Neutrophils Relative %: 41 % — ABNORMAL LOW (ref 43–77)
Platelets: 305 10*3/uL (ref 150–400)
RBC: 4.74 MIL/uL (ref 3.87–5.11)
RDW: 13.3 % (ref 11.5–15.5)
WBC: 8.3 10*3/uL (ref 4.0–10.5)

## 2014-03-19 ENCOUNTER — Ambulatory Visit: Payer: BC Managed Care – PPO | Admitting: Internal Medicine

## 2014-04-13 ENCOUNTER — Encounter: Payer: Self-pay | Admitting: Family Medicine

## 2014-04-13 ENCOUNTER — Telehealth: Payer: Self-pay | Admitting: Internal Medicine

## 2014-04-13 NOTE — Telephone Encounter (Signed)
Pt was seen in  [facility] yesterday dx with tonsillitis and would like to see md this week for 2nd opinion.

## 2014-04-13 NOTE — Telephone Encounter (Signed)
Ok.  Please schedule with the pt.

## 2014-04-14 NOTE — Telephone Encounter (Signed)
lmom for pt to sch Ok 15 min only per [facility]

## 2014-04-14 NOTE — Telephone Encounter (Signed)
lmom for pt to cb

## 2014-04-15 NOTE — Telephone Encounter (Signed)
lmom for pt to sch appt °

## 2014-04-17 NOTE — Telephone Encounter (Signed)
lmom for pt to sch appt °

## 2014-05-18 ENCOUNTER — Telehealth: Payer: Self-pay | Admitting: Internal Medicine

## 2014-05-18 NOTE — Telephone Encounter (Signed)
Pt is due this month for controlled substance follow up.  Please help the pt to get on the schedule and will then ask another physician to review.

## 2014-05-18 NOTE — Telephone Encounter (Signed)
Pt needs new rx generic adderall xr 25 mg. Pt has 4 pills . Pt is aware is md out of office this wk

## 2014-05-19 NOTE — Telephone Encounter (Signed)
lmom for pt to cb

## 2014-05-20 NOTE — Telephone Encounter (Signed)
Patient called back and need an appointment for late morning on Thursday or Friday.  She is scheduled for 06/11/14 at 10:45.  She is aware you will call her back when the RX is ready for pick up.

## 2014-05-21 MED ORDER — AMPHETAMINE-DEXTROAMPHET ER 25 MG PO CP24: 25.0000 mg | ORAL_CAPSULE | ORAL | Status: DC

## 2014-05-21 NOTE — Telephone Encounter (Signed)
Left a message on home phone machine for the pt to pick up rx at the front desk.

## 2014-05-21 NOTE — Telephone Encounter (Signed)
done

## 2014-06-11 ENCOUNTER — Encounter: Payer: Self-pay | Admitting: Internal Medicine

## 2014-06-11 ENCOUNTER — Ambulatory Visit (INDEPENDENT_AMBULATORY_CARE_PROVIDER_SITE_OTHER): Payer: BC Managed Care – PPO | Admitting: Internal Medicine

## 2014-06-11 VITALS — BP 118/80 | Temp 97.9°F | Ht 66.0 in | Wt 171.2 lb

## 2014-06-11 DIAGNOSIS — F909 Attention-deficit hyperactivity disorder, unspecified type: Secondary | ICD-10-CM

## 2014-06-11 DIAGNOSIS — J309 Allergic rhinitis, unspecified: Secondary | ICD-10-CM

## 2014-06-11 DIAGNOSIS — Z79899 Other long term (current) drug therapy: Secondary | ICD-10-CM

## 2014-06-11 DIAGNOSIS — J019 Acute sinusitis, unspecified: Secondary | ICD-10-CM

## 2014-06-11 MED ORDER — AMPHETAMINE-DEXTROAMPHET ER 25 MG PO CP24: 25.0000 mg | ORAL_CAPSULE | ORAL | Status: DC

## 2014-06-11 MED ORDER — AMOXICILLIN-POT CLAVULANATE 875-125 MG PO TABS: 1.0000 | ORAL_TABLET | Freq: Two times a day (BID) | ORAL | Status: DC

## 2014-06-11 NOTE — Progress Notes (Signed)
Pre visit review using our clinic review tool, if applicable. No additional management support is needed unless otherwise documented below in the visit note.  Chief Complaint  Patient presents with  . Follow-up    sinus problem   . ADHD    HPI: Angela Vang 28 y.o.     Comes in for follow up of  adhd add:  Medication management . Since last visit : Taking medication: adderall   Helps  A lot   Every day works   Sleep :  Ok  Mood : moody in afternoon sometimes   Withdrawal.   Medication assessment :No significant side effects such as major sleep issues and mood changes, chest pain, shortness of breath, headaches , GI or significant weight loss. 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    New concern  Fever  89 and pos strep on antibiotic  Husband days ago. She has 1 yo in day care  And has almost 2 months of off and on sx  worse the last week  Face pain PND nasal nad coughing phlegm  Now no st jsut drainage  Sinus infection and cough   Using saline and afrin a good bit .   ROS: See pertinent positives and negatives per HPI. No cp sob  Fever   Has hx of allergy in spring  Not on nasal cortisone  Has been rx in past for sinus infection and shots of something ? steroids  Past Medical History  Diagnosis Date  . Depression   . Anxiety   . History of chickenpox   . Herpes     cold sores no vag lesions  . Hx of varicella   . Hx of gonorrhea     age 28  . Hx of anorexia nervosa   . Multiple pregnancy loss, not currently pregnant 08/05/2011    x 2 first trimester  sees dr Pennie RushingHaygood   . Palpitations 03/27/2013    Family History  Problem Relation Age of Onset  . Diabetes Mother   . Hypertension Mother   . Depression Father   . Bipolar disorder Father   . Leukemia Maternal Grandmother   . Cancer Maternal Grandmother     leaukemia  . Mental illness Brother     suicide att  . Mental illness Paternal Grandfather     suicide att    History   Social  History  . Marital Status: Married    Spouse Name: N/A    Number of Children: N/A  . Years of Education: N/A   Social History Main Topics  . Smoking status: Former Smoker    Quit date: 09/13/2012  . Smokeless tobacco: Former NeurosurgeonUser    Quit date: 09/17/2010  . Alcohol Use: No     Comment: Once a week  . Drug Use: No  . Sexual Activity: Yes   Other Topics Concern  . 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      35 month old infant  To [facility].     HH   3 husband and infant 2 dogs and a  Cat  hh of           Outpatient Encounter  Prescriptions as of 06/11/2014  Medication Sig  . amphetamine-dextroamphetamine (ADDERALL XR) 25 MG 24 hr capsule Take 1 capsule by mouth every morning.  . cetirizine (ZYRTEC) 10 MG tablet Take 10 mg by mouth daily.  . Multiple Vitamins-Minerals (MULTIVITAMIN PO) Take by mouth.  . norethindrone-ethinyl estradiol (JUNEL FE,GILDESS FE,LOESTRIN FE) 1-20 MG-MCG tablet Take 1 tablet by mouth daily.  . [DISCONTINUED] amphetamine-dextroamphetamine (ADDERALL XR) 25 MG 24 hr capsule Take 1 capsule by mouth every morning.  Marland Kitchen. amoxicillin-clavulanate (AUGMENTIN) 875-125 MG per tablet Take 1 tablet by mouth every 12 (twelve) hours. For sinusitis  . amphetamine-dextroamphetamine (ADDERALL XR) 25 MG 24 hr capsule Take 1 capsule by mouth every morning.  Marland Kitchen. amphetamine-dextroamphetamine (ADDERALL XR) 25 MG 24 hr capsule Take 1 capsule by mouth every morning.  . hydrocortisone (ANUSOL-HC) 2.5 % rectal cream Place 1 application rectally 2 (two) times daily. (Patient not taking: Reported on 06/11/2014)  . [DISCONTINUED] levonorgestrel-ethinyl estradiol (ALTAVERA) 0.15-30 MG-MCG tablet Take 1 tablet by mouth daily.  . [DISCONTINUED] Prenatal Multivit-Min-Fe-FA (PRENATAL VITAMINS PO) Take by  mouth daily.    EXAM:  BP 118/80 mmHg  Temp(Src) 97.9 F (36.6 C) (Oral)  Ht 5\' 6"  (1.676 m)  Wt 171 lb 3.2 oz (77.656 kg)  BMI 27.65 kg/m2  Body mass index is 27.65 kg/(m^2).  GENERAL: vitals reviewed and listed above, alert, oriented, appears well hydrated and in no acute distress WDWN in NAD  quiet respirations; very nasallyu congested  somewhat hoarse. Non toxic . HEENT: Normocephalic ;atraumatic , Eyes;  PERRL, EOMs  Full, lids and conjunctiva clear,,Ears: no deformities, canals nl, TM landmarks normal, Nose: no deformity or discharge but congested;face minimally tender Mouth : OP clear without lesion or edema .mild erythema  Neck: Supple without adenopathy or masses or bruits Chest:  Clear to A&P without wheezes rales or rhonchi CV:  S1-S2 no gallops or murmurs peripheral perfusion is normal Skin :nl perfusion and no acute rashes  MS: moves all extremities without noticeable focal  abnormality PSYCH: pleasant and cooperative, no obvious depression or anxiety  ASSESSMENT AND PLAN:  Discussed the following assessment and plan:  Attention deficit hyperactivity disorder (ADHD), unspecified ADHD type - benefoit more than risk  contineu med at this time med check 6 months  Medication management  Acute sinusitis with symptoms greater than 10 days - empiric rx add INCS expectant management  Allergic rhinitis, unspecified allergic rhinitis type  -Patient advised to return or notify health care team  if symptoms worsen ,persist or new concerns arise.  Patient Instructions  Caution with afrin avoid reg use to avoid rebound congestion . Saline is ok  Add antibiotic and nasal cortisone every day  flonase or  nasacort OTC    And  Continue to prevent  Sinus infections. Acts like sinusisit from infection and allergy possible also triggered by   Head cold virus  From day care exposures.  chest is clear today .  Expect improvement  Within 5 days   Contact us  If   persistent or  progressive .  ROV in 6 months med check  Or as needed .   Sinusitis Sinusitis is redness, soreness, and inflammation of the paranasal sinuses. Paranasal sinuses are air pockets within the bones of your face (beneath the eyes, the middle of the forehead, or above the eyes). In healthy paranasal sinuses, mucus is able to drain out, and air is able to circulate through them by way of your nose. However, when your paranasal sinuses are inflamed,  mucus and air can become trapped. This can allow bacteria and other germs to grow and cause infection. Sinusitis can develop quickly and last only a short time (acute) or continue over a long period (chronic). Sinusitis that lasts for more than 12 weeks is considered chronic.  CAUSES  Causes of sinusitis include:  Allergies.  Structural abnormalities, such as displacement of the cartilage that separates your nostrils (deviated septum), which can decrease the air flow through your nose and sinuses and affect sinus drainage.  Functional abnormalities, such as when the small hairs (cilia) that line your sinuses and help remove mucus do not work properly or are not present. SIGNS AND SYMPTOMS  Symptoms of acute and chronic sinusitis are the same. The primary symptoms are pain and pressure around the affected sinuses. Other symptoms include:  Upper toothache.  Earache.  Headache.  Bad breath.  Decreased sense of smell and taste.  A cough, which worsens when you are lying flat.  Fatigue.  Fever.  Thick drainage from your nose, which often is green and may contain pus (purulent).  Swelling and warmth over the affected sinuses. DIAGNOSIS  Your health care provider will perform a physical exam. During the exam, your health care provider may:  Look in your nose for signs of abnormal growths in your nostrils (nasal polyps).  Tap over the affected sinus to check for signs of infection.  View the inside of your sinuses (endoscopy) using an imaging  device that has a light attached (endoscope). If your health care provider suspects that you have chronic sinusitis, one or more of the following tests may be recommended:  Allergy tests.  Nasal culture. A sample of mucus is taken from your nose, sent to a lab, and screened for bacteria.  Nasal cytology. A sample of mucus is taken from your nose and examined by your health care provider to determine if your sinusitis is related to an allergy. TREATMENT  Most cases of acute sinusitis are related to a viral infection and will resolve on their own within 10 days. Sometimes medicines are prescribed to help relieve symptoms (pain medicine, decongestants, nasal steroid sprays, or saline sprays).  However, for sinusitis related to a bacterial infection, your health care provider will prescribe antibiotic medicines. These are medicines that will help kill the bacteria causing the infection.  Rarely, sinusitis is caused by a fungal infection. In theses cases, your health care provider will prescribe antifungal medicine. For some cases of chronic sinusitis, surgery is needed. Generally, these are cases in which sinusitis recurs more than 3 times per year, despite other treatments. HOME CARE INSTRUCTIONS   Drink plenty of water. Water helps thin the mucus so your sinuses can drain more easily.  Use a humidifier.  Inhale steam 3 to 4 times a day (for example, sit in the bathroom with the shower running).  Apply a warm, moist washcloth to your face 3 to 4 times a day, or as directed by your health care provider.  Use saline nasal sprays to help moisten and clean your sinuses.  Take medicines only as directed by your health care provider.  If you were prescribed either an antibiotic or antifungal medicine, finish it all even if you start to feel better. SEEK IMMEDIATE MEDICAL CARE IF:  You have increasing pain or severe headaches.  You have nausea, vomiting, or drowsiness.  You have swelling  around your face.  You have vision problems.  You have a stiff neck.  You have difficulty breathing.  MAKE SURE YOU:   Understand these instructions.  Will watch your condition.  Will get help right away if you are not doing well or get worse. Document Released: 05/29/2005 Document Revised: 10/13/2013 Document Reviewed: 06/13/2011 [facility] Patient Information 2015 [place], [place]. This information is not intended to replace advice given to you by your health care provider. Make sure you discuss any questions you have with your health care provider.      Neta Mends. Evadne Ose M.D.

## 2014-06-11 NOTE — Patient Instructions (Signed)
Caution with afrin avoid reg use to avoid rebound congestion . Saline is ok  Add antibiotic and nasal cortisone every day  flonase or  nasacort OTC    And  Continue to prevent  Sinus infections. Acts like sinusisit from infection and allergy possible also triggered by   Head cold virus  From day care exposures.  chest is clear today .  Expect improvement  Within 5 days   Contact us  If   persistent or progressive .  ROV in 6 months med check  Or as needed .   Sinusitis Sinusitis is redness, soreness, and inflammation of the paranasal sinuses. Paranasal sinuses are air pockets within the bones of your face (beneath the eyes, the middle of the forehead, or above the eyes). In healthy paranasal sinuses, mucus is able to drain out, and air is able to circulate through them by way of your nose. However, when your paranasal sinuses are inflamed, mucus and air can become trapped. This can allow bacteria and other germs to grow and cause infection. Sinusitis can develop quickly and last only a short time (acute) or continue over a long period (chronic). Sinusitis that lasts for more than 12 weeks is considered chronic.  CAUSES  Causes of sinusitis include:  Allergies.  Structural abnormalities, such as displacement of the cartilage that separates your nostrils (deviated septum), which can decrease the air flow through your nose and sinuses and affect sinus drainage.  Functional abnormalities, such as when the small hairs (cilia) that line your sinuses and help remove mucus do not work properly or are not present. SIGNS AND SYMPTOMS  Symptoms of acute and chronic sinusitis are the same. The primary symptoms are pain and pressure around the affected sinuses. Other symptoms include:  Upper toothache.  Earache.  Headache.  Bad breath.  Decreased sense of smell and taste.  A cough, which worsens when you are lying flat.  Fatigue.  Fever.  Thick drainage from your nose, which often is green  and may contain pus (purulent).  Swelling and warmth over the affected sinuses. DIAGNOSIS  Your health care provider will perform a physical exam. During the exam, your health care provider may:  Look in your nose for signs of abnormal growths in your nostrils (nasal polyps).  Tap over the affected sinus to check for signs of infection.  View the inside of your sinuses (endoscopy) using an imaging device that has a light attached (endoscope). If your health care provider suspects that you have chronic sinusitis, one or more of the following tests may be recommended:  Allergy tests.  Nasal culture. A sample of mucus is taken from your nose, sent to a lab, and screened for bacteria.  Nasal cytology. A sample of mucus is taken from your nose and examined by your health care provider to determine if your sinusitis is related to an allergy. TREATMENT  Most cases of acute sinusitis are related to a viral infection and will resolve on their own within 10 days. Sometimes medicines are prescribed to help relieve symptoms (pain medicine, decongestants, nasal steroid sprays, or saline sprays).  However, for sinusitis related to a bacterial infection, your health care provider will prescribe antibiotic medicines. These are medicines that will help kill the bacteria causing the infection.  Rarely, sinusitis is caused by a fungal infection. In theses cases, your health care provider will prescribe antifungal medicine. For some cases of chronic sinusitis, surgery is needed. Generally, these are cases in which sinusitis recurs more  than 3 times per year, despite other treatments. HOME CARE INSTRUCTIONS   Drink plenty of water. Water helps thin the mucus so your sinuses can drain more easily.  Use a humidifier.  Inhale steam 3 to 4 times a day (for example, sit in the bathroom with the shower running).  Apply a warm, moist washcloth to your face 3 to 4 times a day, or as directed by your health care  provider.  Use saline nasal sprays to help moisten and clean your sinuses.  Take medicines only as directed by your health care provider.  If you were prescribed either an antibiotic or antifungal medicine, finish it all even if you start to feel better. SEEK IMMEDIATE MEDICAL CARE IF:  You have increasing pain or severe headaches.  You have nausea, vomiting, or drowsiness.  You have swelling around your face.  You have vision problems.  You have a stiff neck.  You have difficulty breathing. MAKE SURE YOU:   Understand these instructions.  Will watch your condition.  Will get help right away if you are not doing well or get worse. Document Released: 05/29/2005 Document Revised: 10/13/2013 Document Reviewed: 06/13/2011 [facility] Patient Information 2015 [employer] [place]. This information is not intended to replace advice given to you by your health care provider. Make sure you discuss any questions you have with your health care provider.

## 2014-06-16 ENCOUNTER — Telehealth: Payer: Self-pay | Admitting: Internal Medicine

## 2014-06-16 DIAGNOSIS — K625 Hemorrhage of anus and rectum: Secondary | ICD-10-CM

## 2014-06-16 DIAGNOSIS — K649 Unspecified hemorrhoids: Secondary | ICD-10-CM

## 2014-06-16 MED ORDER — HYDROCORTISONE 2.5 % RE CREA: 1.0000 "application " | TOPICAL_CREAM | Freq: Two times a day (BID) | RECTAL | Status: DC

## 2014-06-16 NOTE — Telephone Encounter (Signed)
Sent to the pharmacy by e-scribe. 

## 2014-06-16 NOTE — Telephone Encounter (Signed)
Pt request refill hydrocortisone (ANUSOL-HC) 2.5 % rectal cream [facility] Pt needs asap if possible

## 2014-06-29 ENCOUNTER — Telehealth: Payer: Self-pay | Admitting: Internal Medicine

## 2014-06-29 NOTE — Telephone Encounter (Signed)
Pt saw dr Selena Battenkim 9/25 w/ Hemorids. Pt has gone through 2 tubes of med prescribed and pt states she is worse, pt finding it difficult to even sit at work. first available  Thurs.  Pt would like to know is she can be worked in sooner. pls advise.

## 2014-06-29 NOTE — Telephone Encounter (Signed)
If this has been going on for more than 3 months  With out resolution   advise we refer to GI . Can see her Thursday but sounds like  We will need the refferral for next step.

## 2014-06-30 NOTE — Telephone Encounter (Signed)
Lm on vm for pt.to cb °

## 2014-06-30 NOTE — Telephone Encounter (Signed)
Pt aware appt made

## 2014-07-02 ENCOUNTER — Encounter: Payer: Self-pay | Admitting: Internal Medicine

## 2014-07-02 ENCOUNTER — Ambulatory Visit (INDEPENDENT_AMBULATORY_CARE_PROVIDER_SITE_OTHER): Payer: BLUE CROSS/BLUE SHIELD | Admitting: Internal Medicine

## 2014-07-02 VITALS — BP 120/80 | Temp 98.3°F | Ht 66.0 in | Wt 168.5 lb

## 2014-07-02 DIAGNOSIS — K5900 Constipation, unspecified: Secondary | ICD-10-CM

## 2014-07-02 DIAGNOSIS — K625 Hemorrhage of anus and rectum: Secondary | ICD-10-CM

## 2014-07-02 DIAGNOSIS — R198 Other specified symptoms and signs involving the digestive system and abdomen: Secondary | ICD-10-CM

## 2014-07-02 DIAGNOSIS — K649 Unspecified hemorrhoids: Secondary | ICD-10-CM

## 2014-07-02 MED ORDER — HYDROCORTISONE 2.5 % RE CREA: 1.0000 "application " | TOPICAL_CREAM | Freq: Two times a day (BID) | RECTAL | Status: DC

## 2014-07-02 NOTE — Progress Notes (Signed)
Pre visit review using our clinic review tool, if applicable. No additional management support is needed unless otherwise documented below in the visit note.  Chief Complaint  Patient presents with  . Follow-up    recurrent rectal bleed and pain     HPI: Angela Vang 29 y.o.  Comes in for recurrent problem  Off an on   Getting worse    Painful bm and blood in stool and wipe and painful sitting. Cream at night  Mostly   Pain at least once a week.    Not constipated . Sometimes takes stool softener. Neg fam hx of same .  ROS: See pertinent positives and negatives per HPI.  No weigh t loss and no hx abd pain and fever  [facility]  Past Medical History  Diagnosis Date  . Depression   . Anxiety   . History of chickenpox   . Herpes     cold sores no vag lesions  . Hx of varicella   . Hx of gonorrhea     age 89  . Hx of anorexia nervosa   . Multiple pregnancy loss, not currently pregnant 08/05/2011    x 2 first trimester  sees dr Pennie RushingHaygood   . Palpitations 03/27/2013    Family History  Problem Relation Age of Onset  . Diabetes Mother   . Hypertension Mother   . Depression Father   . Bipolar disorder Father   . Leukemia Maternal Grandmother   . Cancer Maternal Grandmother     leaukemia  . Mental illness Brother     suicide att  . Mental illness Paternal Grandfather     suicide att    History   Social History  . Marital Status: Married    Spouse Name: N/A    Number of Children: N/A  . Years of Education: N/A   Social History Main Topics  . Smoking status: Former Smoker    Quit date: 09/13/2012  . Smokeless tobacco: Former NeurosurgeonUser    Quit date: 09/17/2010  . Alcohol Use: No     Comment: Once a week  . Drug Use: No  . Sexual Activity: Yes   Other Topics Concern  . 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      605 month old infant  To [place].     HH   3 husband and infant 2 dogs and a  Cat  hh of           Outpatient Encounter Prescriptions as of 07/02/2014  Medication Sig  . amphetamine-dextroamphetamine (ADDERALL XR) 25 MG 24 hr capsule Take 1 capsule by mouth every morning.  Marland Kitchen. amphetamine-dextroamphetamine (ADDERALL XR) 25 MG 24 hr capsule Take 1 capsule by mouth every morning.  Marland Kitchen. amphetamine-dextroamphetamine (ADDERALL XR) 25 MG 24 hr capsule Take 1 capsule by mouth every morning.  . cetirizine (ZYRTEC) 10 MG tablet Take 10 mg by mouth daily.  . hydrocortisone (ANUSOL-HC) 2.5 % rectal cream Place 1 application rectally 2 (two) times daily.  . Multiple Vitamins-Minerals (MULTIVITAMIN PO) Take by mouth.  . norethindrone-ethinyl estradiol (JUNEL FE,GILDESS FE,LOESTRIN FE) 1-20 MG-MCG tablet Take 1 tablet by mouth daily.  . [DISCONTINUED] hydrocortisone (  ANUSOL-HC) 2.5 % rectal cream Place 1 application rectally 2 (two) times daily.  . [DISCONTINUED] amoxicillin-clavulanate (AUGMENTIN) 875-125 MG per tablet Take 1 tablet by mouth every 12 (twelve) hours. For sinusitis    EXAM:  BP 120/80 mmHg  Temp(Src) 98.3 F (36.8 C) (Oral)  Ht  (1.676 m)  Wt 168 lb 8 oz (76.431 kg)  BMI 27.21 kg/m2  Body mass index is 27.21 kg/(m^2).  GENERAL: vitals reviewed and listed above, alert, oriented, appears well hydrated and in no acute distress HEENT: atraumatic, conjunctiva  clear, no obvious abnormalities on inspection of external nose and ears declined exam today says husband checked and didn't see anything externally PSYCH: pleasant and cooperative, no obvious depression mildy worried .   ASSESSMENT AND PLAN:  Discussed the following assessment and plan:  BRBPR (bright red blood per rectum) - Plan: hydrocortisone (ANUSOL-HC) 2.5 % rectal cream  Painful defecation - poss fissure chronic  Hemorrhoids, unspecified  hemorrhoid type - Plan: hydrocortisone (ANUSOL-HC) 2.5 % rectal cream Recurrent persistent  No systemic sx  ?poss fissure  Caution chronic  use of med  GI consult   -Patient advised to return or notify health care team  if symptoms worsen ,persist or new concerns arise.  Patient Instructions  We will do a  Referral to GI   You could have a fissure and  Need the area of blood defined . Stool softener as you are doing  .   Someone will call you in the meantime.   This referral. Will refill med in the interim.   Neta Mends. Dovber Ernest M.D.

## 2014-07-02 NOTE — Patient Instructions (Signed)
We will do a  Referral to GI   You could have a fissure and  Need the area of blood defined . Stool softener as you are doing  .   Someone will call you in the meantime.   This referral. Will refill med in the interim.

## 2014-07-08 ENCOUNTER — Encounter: Payer: Self-pay | Admitting: Gastroenterology

## 2014-07-09 ENCOUNTER — Other Ambulatory Visit: Payer: BLUE CROSS/BLUE SHIELD

## 2014-07-09 ENCOUNTER — Telehealth: Payer: Self-pay | Admitting: Gastroenterology

## 2014-07-09 ENCOUNTER — Encounter: Payer: Self-pay | Admitting: Physician Assistant

## 2014-07-09 ENCOUNTER — Ambulatory Visit (INDEPENDENT_AMBULATORY_CARE_PROVIDER_SITE_OTHER): Payer: BLUE CROSS/BLUE SHIELD | Admitting: Physician Assistant

## 2014-07-09 VITALS — BP 100/76 | HR 84 | Ht 65.5 in | Wt 169.4 lb

## 2014-07-09 DIAGNOSIS — K649 Unspecified hemorrhoids: Secondary | ICD-10-CM

## 2014-07-09 DIAGNOSIS — K625 Hemorrhage of anus and rectum: Secondary | ICD-10-CM

## 2014-07-09 DIAGNOSIS — K602 Anal fissure, unspecified: Secondary | ICD-10-CM

## 2014-07-09 MED ORDER — LIDOCAINE (ANORECTAL) 5 % EX CREA
1.0000 | TOPICAL_CREAM | CUTANEOUS | Status: DC | PRN
Start: 2014-07-09 — End: 2015-07-15

## 2014-07-09 MED ORDER — DILTIAZEM GEL 2 %: 1.0000 "application " | Freq: Two times a day (BID) | CUTANEOUS | Status: DC

## 2014-07-09 MED ORDER — HYDROCORTISONE ACETATE 25 MG RE SUPP
25.0000 mg | Freq: Two times a day (BID) | RECTAL | Status: DC
Start: 2014-07-09 — End: 2015-07-15

## 2014-07-09 NOTE — Progress Notes (Signed)
Patient ID: Angela Vang, female   DOB: [birth year], 29 y.o.   MRN: [id number]    HPI:  Angela Vang is a 29 year old female referred for evaluation by Dr.  Fabian Sharp due to hemorrhoids and rectal pain.  Angela Vang states that she has been having intermittent rectal bleeding with blood on the toilet tissue for 4 or 5 months. Her stools are regular and formed and occur on a daily basis. She occasionally has to strain because her stools are hard. She has had periods that last several days where she feels like she is passing glass or razor blades. She has no abdominal pain. She has tried Anusol cream with only slight relief. Her pain has gotten somewhat better but she is fearful that when she has another hard stool she will again have pain she has had no anorexia or weight loss. There is no known family history of colon cancer, colon polyps, or inflammatory bowel disease. She is status post a cesarean section in December 2014, but states she strained for several hours before the C-section and developed hemorrhoids and has had some discomfort since. She has no complaints of epigastric pain, nausea, vomiting, early satiety, or heartburn. She occasionally feels like she has a small lump near her anus, but this comes and goes. Her last menstrual period was 05/29/2014 but she does not feel she is pregnant.   Past Medical History  Diagnosis Date  . Depression   . Anxiety   . History of chickenpox   . Herpes     cold sores no vag lesions  . Hx of varicella   . Hx of gonorrhea     age 7  . Hx of anorexia nervosa   . Multiple pregnancy loss, not currently pregnant 08/05/2011    x 2 first trimester  sees dr Pennie Rushing   . Palpitations 03/27/2013  . ADHD (attention deficit hyperactivity disorder)   . Telogen hair loss   . Calculus of kidney     Past Surgical History  Procedure Laterality Date  . Wisdom tooth extraction    . Dilation and curettage of uterus    . Cesarean section N/A 05/22/2013    Procedure:  Primary Cesarean Section Delivery Baby Girl @ 2137, Apgars 8/9;  Surgeon: Zelphia Cairo, MD;  Location: [facility];  Service: Obstetrics;  Laterality: N/A;  . Kidney stone surgery      with stent   Family History  Problem Relation Age of Onset  . Diabetes Mother   . Hypertension Mother   . Depression Father   . Bipolar disorder Father   . Leukemia Maternal Grandmother   . Mental illness Brother     suicide att  . Mental illness Paternal Grandfather     suicide att  . Breast cancer Maternal Aunt   . Irritable bowel syndrome Mother    History  Substance Use Topics  . Smoking status: Former Smoker    Types: Cigarettes    Quit date: 09/13/2012  . Smokeless tobacco: Never Used     Comment: occasional when drinking  . Alcohol Use: No   Current Outpatient Prescriptions  Medication Sig Dispense Refill  . amphetamine-dextroamphetamine (ADDERALL XR) 25 MG 24 hr capsule Take 1 capsule by mouth every morning. 30 capsule 0  . cetirizine (ZYRTEC) 10 MG tablet Take 10 mg by mouth daily.    . fluticasone (FLONASE) 50 MCG/ACT nasal spray Place 1 spray into both nostrils as needed for allergies or rhinitis.    . hydrocortisone (ANUSOL-HC) 2.5 %  rectal cream Place 1 application rectally 2 (two) times daily. (Patient taking differently: Place 1 application rectally as needed. ) 30 g 0  . Multiple Vitamins-Minerals (MULTIVITAMIN PO) Take by mouth.    . norethindrone-ethinyl estradiol (JUNEL FE,GILDESS FE,LOESTRIN FE) 1-20 MG-MCG tablet Take 1 tablet by mouth daily.    Marland Kitchen. diltiazem 2 % GEL Apply 1 application topically 2 (two) times daily. 1 Package 1  . hydrocortisone (ANUSOL-HC) 25 MG suppository Place 1 suppository (25 mg total) rectally 2 (two) times daily. 20 suppository 1  . Lidocaine, Anorectal, (RECTICARE) 5 % CREA Apply 1 application topically as needed. 1 Tube 0   No current facility-administered medications for this visit.   No Known Allergies   Review of Systems: Gen: Denies any  fever, chills, sweats, anorexia, fatigue, weakness, malaise, weight loss, and sleep disorder CV: Denies chest pain, angina, palpitations, syncope, orthopnea, PND, peripheral edema, and claudication. Resp: Denies dyspnea at rest, dyspnea with exercise, cough, sputum, wheezing, coughing up blood, and pleurisy. GI: Denies vomiting blood, jaundice, and fecal incontinence.   Denies dysphagia or odynophagia. GU : Denies urinary burning, blood in urine, urinary frequency, urinary hesitancy, nocturnal urination, and urinary incontinence. MS: Denies joint pain, limitation of movement, and swelling, stiffness, low back pain, extremity pain. Denies muscle weakness, cramps, atrophy.  Derm: Denies rash, itching, dry skin, hives, moles, warts, or unhealing ulcers.  Psych: Denies depression, anxiety, memory loss, suicidal ideation, hallucinations, paranoia, and confusion. Heme: Denies bruising, bleeding, and enlarged lymph nodes. Neuro:  Denies any headaches, dizziness, paresthesias. Endo:  Denies any problems with DM, thyroid, adrenal function    Physical Exam: BP 100/76 mmHg  Pulse 84  Ht 5' 5.5" (1.664 m)  Wt 169 lb 6 oz (76.828 kg)  BMI 27.75 kg/m2  LMP 05/29/2014  Breastfeeding? No Constitutional: Pleasant,well-developed female in no acute distress. HEENT: Normocephalic and atraumatic. Conjunctivae are normal. No scleral icterus. Neck supple. No thyromegaly Cardiovascular: Normal rate, regular rhythm.  Pulmonary/chest: Effort normal and breath sounds normal. No wheezing, rales or rhonchi. Abdominal: Soft, nondistended, nontender. Bowel sounds active throughout. There are no masses palpable. No hepatomegaly. Rectal: external skin tag, brwon stool heme negative. Anoscopy with a small posterior fissure and internal hemorrhoid on left. Extremities: no edema Lymphadenopathy: No cervical adenopathy noted. Neurological: Alert and oriented to person place and time. Skin: Skin is warm and dry. No rashes  noted. Psychiatric: Normal mood and affect. Behavior is normal.  ASSESSMENT AND PLAN: 29 year old female with a 4-5 month history of rectal pain and intermittent rectal bleeding here for evaluation. Her discomfort and bleeding are likely due to her hemorrhoid and fissure. He opted to use a stool softener. She will be given a trial of diltiazem 2% ointment to use rectally 3 times a day for 3 weeks. She will also use Anusol HC suppositories 1 per rectum twice a day for 10 days as well as rectal care cream as needed for the discomfort. She will follow up in 3-4 weeks, sooner if needed.    Yared Barefoot, Tollie PizzaLori P PA-C 07/09/2014, 2:53 PM

## 2014-07-09 NOTE — Telephone Encounter (Signed)
Pharmacy did not get script for diltiazem gel electronically. Called in script to pharmacy and pt aware.

## 2014-07-09 NOTE — Patient Instructions (Signed)
We have sent medications to your pharmacy for you to pick up at your convenience. Your physician has requested that you go to the basement for  lab work before leaving today. Please wait for results from pregnancy test prior to starting meds. Follow up with Dr Arlyce DiceKaplan on 08/28/14 at 9:15 am CC:  Berniece AndreasWanda Panosh MD

## 2014-07-10 ENCOUNTER — Other Ambulatory Visit: Payer: BLUE CROSS/BLUE SHIELD

## 2014-07-10 ENCOUNTER — Other Ambulatory Visit: Payer: Self-pay

## 2014-07-10 DIAGNOSIS — K625 Hemorrhage of anus and rectum: Secondary | ICD-10-CM

## 2014-07-10 LAB — PREGNANCY, URINE

## 2014-07-11 LAB — PREGNANCY, URINE: Preg Test, Ur: NEGATIVE

## 2014-07-29 ENCOUNTER — Other Ambulatory Visit: Payer: Self-pay | Admitting: *Deleted

## 2014-07-29 ENCOUNTER — Telehealth: Payer: Self-pay | Admitting: *Deleted

## 2014-07-29 MED ORDER — DILTIAZEM GEL 2 %: 1.0000 "application " | Freq: Two times a day (BID) | CUTANEOUS | Status: DC

## 2014-07-29 NOTE — Telephone Encounter (Signed)
LM for the patient on her home number that we sent refills for the [facility]iltiazem [facility] 2 % [facility], [address]

## 2014-07-29 NOTE — Telephone Encounter (Signed)
[facility], [address]  [phone].  Spoke to pharmacist.  Per Lawson FiscalLori Hvoxdovic PA-C, they can refill 1,  30 gram tube of Diltiazem Gel 2 % with 1 refill.

## 2014-08-14 ENCOUNTER — Other Ambulatory Visit: Payer: Self-pay | Admitting: Obstetrics and Gynecology

## 2014-08-17 LAB — CYTOLOGY - PAP

## 2014-08-28 ENCOUNTER — Ambulatory Visit: Payer: Self-pay | Admitting: Gastroenterology

## 2014-12-01 ENCOUNTER — Telehealth: Payer: Self-pay | Admitting: Gastroenterology

## 2014-12-01 ENCOUNTER — Other Ambulatory Visit: Payer: Self-pay

## 2014-12-01 MED ORDER — DILTIAZEM GEL 2 %: 1.0000 "application " | Freq: Two times a day (BID) | CUTANEOUS | Status: DC

## 2014-12-01 NOTE — Telephone Encounter (Signed)
I recommend starting diltiazem ointment for 3 months, regardless of symptoms.  Office visit 3 months

## 2014-12-01 NOTE — Telephone Encounter (Signed)
Advised patient of the plan. Scheduled a follow up in 3 months. Rx to [facility].

## 2014-12-01 NOTE — Telephone Encounter (Signed)
Patient was seen 07/09/14 for the same symptoms. She has painful bowel movements, stinging sensation in rectal area. She has blood with wiping. It becomes uncomfortable if she has to sit. She was diagnosed with external hemorrhoids and probable anal fissure. She reports resolution of these symptoms for several months with treatment.(diltiazem 2% ointment to use rectally 3 times a day for 3 weeks. She will also use Anusol HC suppositories 1 per rectum twice a day for 10 days as well as rectal care cream as needed for the discomfort.)This present episode began over the weekend. She would like to repeat the treatment. Also wants to know if she should see a surgeon due to the recurrence.Please advise.

## 2015-01-18 ENCOUNTER — Telehealth: Payer: Self-pay | Admitting: Internal Medicine

## 2015-01-18 MED ORDER — AMPHETAMINE-DEXTROAMPHET ER 25 MG PO CP24: 25.0000 mg | ORAL_CAPSULE | ORAL | Status: DC

## 2015-01-18 NOTE — Telephone Encounter (Signed)
Pt called back and sch an appt for 08/24 (First available with Dr Demetrius Charity)  but will need a refill prior to that. She only has 7 days left.

## 2015-01-18 NOTE — Telephone Encounter (Signed)
Pt is past due for her follow up with Dr. Fabian Sharp.  Please help her to make an appointment and then I will see if medication can be filled until she is seen.

## 2015-01-18 NOTE — Telephone Encounter (Signed)
Patient called in for a RX refill on her Adderall 25 mg. Please give patient a call when it is ready for pickup/es

## 2015-01-18 NOTE — Telephone Encounter (Signed)
Left a message for the pt to pick up at the front desk. 

## 2015-01-18 NOTE — Telephone Encounter (Signed)
Refill once 

## 2015-02-03 ENCOUNTER — Encounter: Payer: BLUE CROSS/BLUE SHIELD | Admitting: Internal Medicine

## 2015-02-03 NOTE — Progress Notes (Signed)
Document opened and reviewed for OV but appt  canceled same day .  

## 2015-02-24 ENCOUNTER — Ambulatory Visit: Payer: Self-pay | Admitting: Gastroenterology

## 2015-07-15 ENCOUNTER — Encounter: Payer: Self-pay | Admitting: Internal Medicine

## 2015-07-15 ENCOUNTER — Ambulatory Visit (INDEPENDENT_AMBULATORY_CARE_PROVIDER_SITE_OTHER): Payer: BLUE CROSS/BLUE SHIELD | Admitting: Internal Medicine

## 2015-07-15 VITALS — BP 112/82 | Temp 98.5°F | Wt 201.6 lb

## 2015-07-15 DIAGNOSIS — Z72820 Sleep deprivation: Secondary | ICD-10-CM

## 2015-07-15 DIAGNOSIS — R5383 Other fatigue: Secondary | ICD-10-CM | POA: Diagnosis not present

## 2015-07-15 DIAGNOSIS — R631 Polydipsia: Secondary | ICD-10-CM | POA: Diagnosis not present

## 2015-07-15 DIAGNOSIS — R635 Abnormal weight gain: Secondary | ICD-10-CM | POA: Diagnosis not present

## 2015-07-15 LAB — LIPID PANEL
Cholesterol: 183 mg/dL (ref 0–200)
HDL: 56.2 mg/dL (ref >39.00)
LDL Cholesterol: 95 mg/dL (ref 0–99)
NonHDL: 126.64
Total CHOL/HDL Ratio: 3
Triglycerides: 156 mg/dL — ABNORMAL HIGH (ref 0.0–149.0)
VLDL: 31.2 mg/dL (ref 0.0–40.0)

## 2015-07-15 LAB — HEPATIC FUNCTION PANEL
ALT: 26 U/L (ref 0–35)
AST: 23 U/L (ref 0–37)
Albumin: 3.8 g/dL (ref 3.5–5.2)
Alkaline Phosphatase: 43 U/L (ref 39–117)
Bilirubin, Direct: 0.1 mg/dL (ref 0.0–0.3)
Total Bilirubin: 0.3 mg/dL (ref 0.2–1.2)
Total Protein: 7.1 g/dL (ref 6.0–8.3)

## 2015-07-15 LAB — CBC WITH DIFFERENTIAL/PLATELET
Basophils Absolute: 0 10*3/uL (ref 0.0–0.1)
Basophils Relative: 0.4 % (ref 0.0–3.0)
Eosinophils Absolute: 0.2 10*3/uL (ref 0.0–0.7)
Eosinophils Relative: 2.4 % (ref 0.0–5.0)
HCT: 40.9 % (ref 36.0–46.0)
Hemoglobin: 13.7 g/dL (ref 12.0–15.0)
Lymphocytes Relative: 34.3 % (ref 12.0–46.0)
Lymphs Abs: 3.1 10*3/uL (ref 0.7–4.0)
MCHC: 33.5 g/dL (ref 30.0–36.0)
MCV: 85.2 fl (ref 78.0–100.0)
Monocytes Absolute: 0.5 10*3/uL (ref 0.1–1.0)
Monocytes Relative: 5.7 % (ref 3.0–12.0)
Neutro Abs: 5.2 10*3/uL (ref 1.4–7.7)
Neutrophils Relative %: 57.2 % (ref 43.0–77.0)
Platelets: 243 10*3/uL (ref 150.0–400.0)
RBC: 4.8 Mil/uL (ref 3.87–5.11)
RDW: 13.2 % (ref 11.5–15.5)
WBC: 9.1 10*3/uL (ref 4.0–10.5)

## 2015-07-15 LAB — POCT URINALYSIS DIPSTICK
Bilirubin, UA: NEGATIVE
Blood, UA: NEGATIVE
Glucose, UA: NEGATIVE
Ketones, UA: NEGATIVE
Leukocytes, UA: NEGATIVE
Nitrite, UA: NEGATIVE
Protein, UA: NEGATIVE
Spec Grav, UA: 1.025
Urobilinogen, UA: 0.2
pH, UA: 5.5

## 2015-07-15 LAB — BASIC METABOLIC PANEL
BUN: 10 mg/dL (ref 6–23)
CO2: 25 mEq/L (ref 19–32)
Calcium: 9.3 mg/dL (ref 8.4–10.5)
Chloride: 107 mEq/L (ref 96–112)
Creatinine, Ser: 0.69 mg/dL (ref 0.40–1.20)
GFR: 106.95 mL/min (ref >60.00)
Glucose, Bld: 86 mg/dL (ref 70–99)
Potassium: 4.7 mEq/L (ref 3.5–5.1)
Sodium: 139 mEq/L (ref 135–145)

## 2015-07-15 LAB — TSH: TSH: 1.44 u[IU]/mL (ref 0.35–4.50)

## 2015-07-15 LAB — T4, FREE: Free T4: 0.85 ng/dL (ref 0.60–1.60)

## 2015-07-15 LAB — HEMOGLOBIN A1C: Hgb A1c MFr Bld: 5.1 % (ref 4.6–6.5)

## 2015-07-15 LAB — POCT URINE PREGNANCY: Preg Test, Ur: NEGATIVE

## 2015-07-15 NOTE — Progress Notes (Signed)
Pre visit review using our clinic review tool, if applicable. No additional management support is needed unless otherwise documented below in the visit note.  Chief Complaint  Patient presents with  . Fatigue    Ongoing for 6 months  . Polydipsia  . Shortness of Breath  . Weight Gain    HPI: Patient Angela Vang  comes in today for for  new problem evaluation. Last visit was  Jan 16   Thirsty  All the time   .   6 months weight gain fatigue   nopolyuria  . On ocps no menses for 2 months   No sa " cant be pregnant"   Works 40 hours   And  Has 29 year old at home  walkin up at night .   Most nights  fets 5-6 hours interrupted sleep . Stress anxiety on going from familu issues   Hx of  Counseling and  [facility] in past   zoloft no help so stopped  .   Had panic attack at work and coworker gave her a med that helped her at that time. ( related to family issues) fam hx of dm  ROS: See pertinent positives and negatives per HPI.  Past Medical History  Diagnosis Date  . Depression   . Anxiety   . History of chickenpox   . Herpes     cold sores no vag lesions  . Hx of varicella   . Hx of gonorrhea     age 75  . Hx of anorexia nervosa   . Multiple pregnancy loss, not currently pregnant 08/05/2011    x 2 first trimester  sees dr Pennie Rushing   . Palpitations 03/27/2013  . ADHD (attention deficit hyperactivity disorder)   . Telogen hair loss   . Calculus of kidney     Family History  Problem Relation Age of Onset  . Diabetes Mother   . Hypertension Mother   . Depression Father   . Bipolar disorder Father   . Leukemia Maternal Grandmother   . Mental illness Brother     suicide att  . Mental illness Paternal Grandfather     suicide att  . Breast cancer Maternal Aunt   . Irritable bowel syndrome Mother     Social History   Social History  . Marital Status: Married    Spouse Name: N/A  . Number of Children: 1  . Years of Education: N/A    Occupational History  . [employer]    Social History Main Topics  . Smoking status: Former Smoker    Types: Cigarettes    Quit date: 09/13/2012  . Smokeless tobacco: Never Used     Comment: occasional when drinking  . Alcohol Use: No  . Drug Use: No  . Sexual Activity: Yes   Other Topics Concern  . 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    29 yo goes to daycare   HH   3 husband and infant 2 dogs and a  Cat  hh of           Outpatient Prescriptions Prior to Visit  Medication Sig Dispense Refill  . cetirizine (ZYRTEC) 10 MG tablet  Take 10 mg by mouth daily.    . fluticasone (FLONASE) 50 MCG/ACT nasal spray Place 1 spray into both nostrils as needed for allergies or rhinitis.    . Multiple Vitamins-Minerals (MULTIVITAMIN PO) Take by mouth.    . norethindrone-ethinyl estradiol (JUNEL FE,GILDESS FE,LOESTRIN FE) 1-20 MG-MCG tablet Take 1 tablet by mouth daily.    Marland Kitchen amphetamine-dextroamphetamine (ADDERALL XR) 25 MG 24 hr capsule Take 1 capsule by mouth every morning. 30 capsule 0  . diltiazem 2 % GEL Apply 1 application topically 2 (two) times daily. Use for 3 months 1 Package 2  . hydrocortisone (ANUSOL-HC) 2.5 % rectal cream Place 1 application rectally 2 (two) times daily. (Patient taking differently: Place 1 application rectally as needed. ) 30 g 0  . hydrocortisone (ANUSOL-HC) 25 MG suppository Place 1 suppository (25 mg total) rectally 2 (two) times daily. 20 suppository 1  . Lidocaine, Anorectal, (RECTICARE) 5 % CREA Apply 1 application topically as needed. 1 Tube 0   No facility-administered medications prior to visit.     EXAM:  BP 112/82 mmHg  Temp(Src) 98.5 F (36.9 C) (Oral)  Wt 201 lb 9.6 oz (91.445 kg)  Body mass index is 33.03 kg/(m^2).  GENERAL: vitals reviewed and  listed above, alert, oriented, appears well hydrated and in no acute distress HEENT: atraumatic, conjunctiva  clear, no obvious abnormalities on inspection of external nose and ears OP : no lesion edema or exudate  NECK: no obvious masses on inspection palpation  LUNGS: clear to auscultation bilaterally, no wheezes, rales or rhonchi, good air movement CV: HRRR, no clubbing cyanosis or  peripheral edema nl cap refill  Abdomen:  Sof,t normal bowel sounds without hepatosplenomegaly, no guarding rebound or masses no CVA tendernessskin few stria lower abd no acne hair inc MS: moves all extremities without noticeable focal  abnormality PSYCH: pleasant and cooperative, cognition intact   emotional when talking of family problems but not depressed per se  Wt Readings from Last 3 Encounters:  07/15/15 201 lb 9.6 oz (91.445 kg)  07/09/14 169 lb 6 oz (76.828 kg)  07/02/14 168 lb 8 oz (76.431 kg)   BP Readings from Last 3 Encounters:  07/15/15 112/82  07/09/14 100/76  07/02/14 120/80     ASSESSMENT AND PLAN:  Discussed the following assessment and plan:  Other fatigue - Plan: Basic metabolic panel, CBC with Differential/Platelet, Hemoglobin A1c, Hepatic function panel, Lipid panel, TSH, T4, free, POCT urinalysis dipstick, POCT urine pregnancy  Weight gain - Plan: Basic metabolic panel, CBC with Differential/Platelet, Hemoglobin A1c, Hepatic function panel, Lipid panel, TSH, T4, free, POCT urinalysis dipstick, POCT urine pregnancy  Increased thirst - Plan: Basic metabolic panel, CBC with Differential/Platelet, Hemoglobin A1c, Hepatic function panel, Lipid panel, TSH, T4, free, POCT urinalysis dipstick, POCT urine pregnancy  Sleep deprivation fam hx of dm  weight gain prob related to other issues but ro metabolic  Disc after  lsi if still problematic panic then go back to counselor  For poss CBT or EBT +- medication -Patient advised to return or notify health care team  if symptoms worsen ,persist  or new concerns arise. Sign up for my chart advised  Patient Instructions  Will notify you  of labs when available. Sleep deprivation is associated with weight gain and fatigue . Track sleep  Eating for at least 2 weeks to see if can get a more predictable  pattern that will help nutritions and sleep .  Avoid simple carbs   Sugar drinks to  Decrease risk of getting diabetes.   See you gyne about the ocps and periods     Neta Mends. Mosetta Ferdinand M.D.

## 2015-07-15 NOTE — Patient Instructions (Signed)
Will notify you  of labs when available. Sleep deprivation is associated with weight gain and fatigue . Track sleep  Eating for at least 2 weeks to see if can get a more predictable  pattern that will help nutritions and sleep .  Avoid simple carbs   Sugar drinks to  Decrease risk of getting diabetes.   See you gyne about the ocps and periods

## 2015-07-20 ENCOUNTER — Telehealth: Payer: Self-pay | Admitting: Family Medicine

## 2015-07-20 ENCOUNTER — Telehealth: Payer: Self-pay | Admitting: Internal Medicine

## 2015-07-20 NOTE — Telephone Encounter (Signed)
Pt notified of lab results from 07/15/15 by telephone.

## 2015-07-20 NOTE — Telephone Encounter (Signed)
Pt notified of lab results from 07/15/15 by telephone. 

## 2015-07-20 NOTE — Telephone Encounter (Signed)
Patient returned CMA's telephone call.  She said to please call her back at the same number.

## 2015-08-30 ENCOUNTER — Ambulatory Visit (INDEPENDENT_AMBULATORY_CARE_PROVIDER_SITE_OTHER): Payer: BLUE CROSS/BLUE SHIELD | Admitting: Family Medicine

## 2015-08-30 ENCOUNTER — Encounter: Payer: Self-pay | Admitting: Family Medicine

## 2015-08-30 VITALS — BP 120/80 | HR 94 | Temp 98.8°F | Wt 201.0 lb

## 2015-08-30 DIAGNOSIS — M545 Low back pain, unspecified: Secondary | ICD-10-CM

## 2015-08-30 MED ORDER — CYCLOBENZAPRINE HCL 5 MG PO TABS: 5.0000 mg | ORAL_TABLET | Freq: Three times a day (TID) | ORAL | Status: DC | PRN

## 2015-08-30 NOTE — Progress Notes (Signed)
Angela Conch, MD  Subjective:  Angela Vang is a 30 y.o. year old very pleasant female patient who presents for/with See problem oriented charting ROS- see ros below in HPI  Past Medical History-  Patient Active Problem List   Diagnosis Date Noted  . Acute sinusitis with symptoms greater than 10 days 06/11/2014  . Medication management 06/11/2014  . Hair loss 02/19/2014  . 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 Attention deficit hyperactivity disorder (ADHD), combined type 02/19/2014  . Calculus of kidney 02/05/2014  . S/P cesarean section 05/23/2013  . Personal history of previous postdates pregnancy 05/21/2013  . Multiple pregnancy loss, not currently pregnant 08/05/2011  . ADHD (attention deficit hyperactivity disorder) 08/02/2011    Medications- reviewed and updated Current Outpatient Prescriptions  Medication Sig Dispense Refill  . cetirizine (ZYRTEC) 10 MG tablet Take 10 mg by mouth daily.    . fluticasone (FLONASE) 50 MCG/ACT nasal spray Place 1 spray into both nostrils as needed for allergies or rhinitis.    . Multiple Vitamins-Minerals (MULTIVITAMIN PO) Take by mouth.    . norethindrone-ethinyl estradiol (JUNEL FE,GILDESS FE,LOESTRIN FE) 1-20 MG-MCG tablet Take 1 tablet by mouth daily.     No current facility-administered medications for this visit.    Objective: BP 120/80 mmHg  Pulse 94  Temp(Src) 98.8 F (37.1 C)  Wt 201 lb (91.173 kg) Gen: NAD, resting comfortably CV: RRR no murmurs rubs or gallops Lungs: CTAB no crackles, wheeze, rhonchi Abdomen: soft/nontender/nondistended/normal bowel sounds. No rebound or guarding.  Ext: no edema Skin: warm, dry Neuro: grossly normal, moves all extremities  Back - Normal skin, Spine with normal alignment and no deformity.  No tenderness to vertebral process palpation.  Paraspinous muscles are very tender and with spasm on left.   Range of motion is full at neck and lumbar sacral regions. Negative Straight leg raise.   Neuro- no saddle anesthesia, 5/5 strength lower extremities, 2+ reflexes   Assessment/Plan:  Left Back Pain S:Just walking on Friday and felt a sudden severe pain in her left low back. Constant pain- unable to sleep with it. With standing up 10/10. Laying on stomach with back straight worse. Better with sitting down at 2/10. Does go into buttocks some but not into leg or down to foot. Used a muscle relaxant from her mother in law- did seem to help. Was able to sleep 4 hours with this. Ice and heating pad did not help very much. No history of low back pain. Had been getting some random back pains in her side and back for a year but very brief and less intense than current episode. Of note she is on birth control.  ROS-No saddle anesthesia, bladder incontinence, fecal incontinence, weakness in extremity, numbness or tingling in extremity. History negative for trauma, history of cancer, fever, chills, unintentional weight loss, recent bacterial infection, recent IV drug use, HIV A/P: Left-sided low back pain without sciatica - Plan: Ambulatory referral to Physical Therapy She has some significant spasm in left low back. We discussed the primary treatment and longterm prevention for her ailment is PT to strengthen core muscles. She has had kidney stones but with reproducible pain and no CVA pain I doubt this. We will also treat her with flexeril for prn use mainly at night given how much muscle relaxants have helped. I advised her not to take medicine from a family member but thanked her for being truthful.   PRN for lack of improvement. Return precautions advised.   Orders Placed  This Encounter  Procedures  . Ambulatory referral to Physical Therapy    Referral Priority:  Routine    Referral Type:  Physical Medicine    Referral Reason:  Specialty Services Required    Requested Specialty:  Physical Therapy    Number of Visits Requested:  1    Meds ordered this encounter  Medications  .  cyclobenzaprine (FLEXERIL) 5 MG tablet    Sig: Take 1 tablet (5 mg total) by mouth 3 (three) times daily as needed for muscle spasms.    Dispense:  30 tablet    Refill:  0

## 2015-08-30 NOTE — Patient Instructions (Signed)
Left-sided low back pain without sciatica (not running into leg)-   Plan: Ambulatory referral to Physical Therapy. We will call you within a week about your referral. If you do not hear within 7 days, give us a call.   Also gave short term supply of flexeril- do not drive for at least 8 hours after taking so generally best to take at night before bed.   May continue heat 20 minutes 3x a day.   Consider massage therapy as well

## 2015-09-13 ENCOUNTER — Ambulatory Visit: Payer: Self-pay | Admitting: Internal Medicine

## 2015-11-23 ENCOUNTER — Telehealth: Payer: Self-pay | Admitting: Internal Medicine

## 2015-11-23 NOTE — Telephone Encounter (Signed)
Cannot guarantee that can complete a FMLA   havent seen her since  February but  Has seen dr Durene CalHunter in the spring.  However can make appt  830 on Friday am  And have her come in   No later than 8 15 am  [facility]

## 2015-11-23 NOTE — Telephone Encounter (Signed)
Pt needs to see you asap (this week) and discuss FMLA paperwork.  Pt states her job is in jeopardy if she cannot    get you to fill out her papers.  Pt states she works a 10 hour shift and can only come in between 8 -10 am  because she cannot miss anymore work. Pt wants to discuss all that has been going on, so I am sure this will be 30 min. No appointments except 15 min same day,, so not sure what to do

## 2015-11-24 NOTE — Telephone Encounter (Addendum)
Pt has been scheduled.  °

## 2015-11-26 ENCOUNTER — Encounter: Payer: Self-pay | Admitting: Internal Medicine

## 2015-11-26 ENCOUNTER — Ambulatory Visit (INDEPENDENT_AMBULATORY_CARE_PROVIDER_SITE_OTHER): Payer: BLUE CROSS/BLUE SHIELD | Admitting: Internal Medicine

## 2015-11-26 VITALS — BP 118/86 | Temp 98.4°F | Wt 206.7 lb

## 2015-11-26 DIAGNOSIS — F439 Reaction to severe stress, unspecified: Secondary | ICD-10-CM

## 2015-11-26 DIAGNOSIS — F4321 Adjustment disorder with depressed mood: Secondary | ICD-10-CM

## 2015-11-26 DIAGNOSIS — Z658 Other specified problems related to psychosocial circumstances: Secondary | ICD-10-CM

## 2015-11-26 NOTE — Progress Notes (Signed)
Chief Complaint  Patient presents with  . Depression    Needs FMLA paper work filled out.      HPI: Angela Vang 30 y.o. comes in today on a work in emergent basis because she's afraid she can lose her job. 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 She feels it is making her depressed and she is tearful at times. "I have missed a lot of work because of  Feeling like a horrible mom and overwhelming sx recently "  Onset since  Having child   And time home   And now gotten worse.   Because of depression nothing   Using aap at work not helpful.  And hx  [facility]  In 2011  Post partum depression and gyne saw her for that.   Put her on zoloft and took  For months says didn't help .  2014  ROS: See pertinent positives and negatives per HPI. No cp sob syncope suicidal thoughts  Neg td  Wine 1 per night  Past Medical History  Diagnosis Date  . Depression   . Anxiety   . History of chickenpox   . Herpes     cold sores no vag lesions  . Hx of varicella   . Hx of gonorrhea     age 40  . Hx of anorexia nervosa   . Multiple pregnancy loss, not currently pregnant 08/05/2011    x 2 first trimester  sees dr Leo Grosser   . Palpitations 03/27/2013  . ADHD (attention deficit hyperactivity disorder)   . Telogen hair loss   . Calculus of kidney     Family History  Problem Relation Age of Onset  . Diabetes Mother   . Hypertension Mother   . Depression Father   . Bipolar disorder Father   . Leukemia Maternal Grandmother   . Mental illness Brother     suicide att  . Mental illness Paternal Grandfather     suicide att  . Breast cancer Maternal Aunt   . Irritable bowel syndrome Mother     Social History   Social History  . Marital Status: Married    Spouse Name: N/A  . Number of Children: 1  . Years of Education: N/A   Occupational History  . [employer]    Social History Main Topics  .  Smoking status: Former Smoker    Types: Cigarettes    Quit date: 09/13/2012  . Smokeless tobacco: Never Used     Comment: occasional when drinking  . Alcohol Use: No  . Drug Use: No  . Sexual Activity: Yes   Other Topics Concern  . None   Social History Narrative    rare and ocass etoh    FA apparently stored and safe    Uses seat belts    5 30 - 2 am [place] [employer] on phones   Preferred cust program amer [employer]    G2P0 2 first trimester losses.    29 yo goes to daycare   [place]   3 husband and infant 2 dogs and a  Cat  hh of           Outpatient Prescriptions Prior to Visit  Medication Sig Dispense Refill  . cetirizine (ZYRTEC) 10 MG tablet Take 10 mg by mouth daily.    . fluticasone (FLONASE) 50 MCG/ACT nasal spray Place 1 spray into both nostrils  as needed for allergies or rhinitis.    . Multiple Vitamins-Minerals (MULTIVITAMIN PO) Take by mouth.    . norethindrone-ethinyl estradiol (JUNEL FE,GILDESS FE,LOESTRIN FE) 1-20 MG-MCG tablet Take 1 tablet by mouth daily.    . cyclobenzaprine (FLEXERIL) 5 MG tablet Take 1 tablet (5 mg total) by mouth 3 (three) times daily as needed for muscle spasms. 30 tablet 0   No facility-administered medications prior to visit.     EXAM:  BP 118/86 mmHg  Temp(Src) 98.4 F (36.9 C) (Oral)  Wt 206 lb 11.2 oz (93.759 kg)  Body mass index is 33.86 kg/(m^2).  GENERAL: vitals reviewed and listed above, alert, oriented, appears well hydrated and in no acute distress tearful  Emotional at times  HEENT: atraumatic, conjunctiva  clear, no obvious abnormalities on inspection of external nose and earsMS: moves all extremities without noticeable focal  abnormality PSYCH: pleasant and cooperative, no obvious depression or anxiety PHQ15  13 somatic  11 gad no panic  phq9 18 overeating energy interest   Failure  3 , motor and down 1 and 2   Not suicidal.  ASSESSMENT AND PLAN:  Discussed the following assessment and plan:  Adjustment disorder with  depressed mood - External triggers history of same reported minimal effect of various counseling and medications in the past for other reasons. Decrease work intensity and time   Stress Depressive symptoms aggravated by long work schedules 10 hours a day. Intensity 5 days a week plus a 76-53-year-old at home late hours. She reiterates that history of medication in the remote past for postpartum depression and multiple counseling's in the past haven't really been helpful. She basically improved based on her and interventions. I agree that reducing her work day to about 6 hours would give her time to have a down time family time sleep exercise etc. which would be healthier for her. We will follow her up in about a month's. If needed we may advise a psychiatric consult for medication consideration. Counseled caution with alcohol she is not using very much but to avoid pitfalls .  2 forms reviewed completed 1 to be faxed for her work. Review of chart thyroid has been done most recently and within normal limits. -Patient advised to return or notify health care team  if symptoms worsen ,persist or new concerns arise. Total visit 76mns > 50% spent counseling and coordinating care as indicated in above note and in instructions to patient .   Patient Instructions  Decrease work  To 6 hours per day at this time Attend to healthy eating sleep  And acitivy .  ROV in July  About 13 14  To reasess to se how doing  And make further advice .    Stress and Stress Management Stress is a normal reaction to life events. It is what you feel when life demands more than you are used to or more than you can handle. Some stress can be useful. For example, the stress reaction can help you catch the last bus of the day, study for a test, or meet a deadline at work. But stress that occurs too often or for too long can cause problems. It can affect your emotional health and interfere with relationships and normal daily  activities. Too much stress can weaken your immune system and increase your risk for physical illness. If you already have a medical problem, stress can make it worse. CAUSES  All sorts of life events may cause stress. An event that causes  stress for one person may not be stressful for another person. Major life events commonly cause stress. These may be positive or negative. Examples include losing your job, moving into a new home, getting married, having a baby, or losing a loved one. Less obvious life events may also cause stress, especially if they occur day after day or in combination. Examples include working long hours, driving in traffic, caring for children, being in debt, or being in a difficult relationship. SIGNS AND SYMPTOMS Stress may cause emotional symptoms including, the following:  Anxiety. This is feeling worried, afraid, on edge, overwhelmed, or out of control.  Anger. This is feeling irritated or impatient.  Depression. This is feeling sad, down, helpless, or guilty.  Difficulty focusing, remembering, or making decisions. Stress may cause physical symptoms, including the following:   Aches and pains. These may affect your head, neck, back, stomach, or other areas of your body.  Tight muscles or clenched jaw.  Low energy or trouble sleeping. Stress may cause unhealthy behaviors, including the following:   Eating to feel better (overeating) or skipping meals.  Sleeping too little, too much, or both.  Working too much or putting off tasks (procrastination).  Smoking, drinking alcohol, or using drugs to feel better. DIAGNOSIS  Stress is diagnosed through an assessment by your health care provider. Your health care provider will ask questions about your symptoms and any stressful life events.Your health care provider will also ask about your medical history and may order blood tests or other tests. Certain medical conditions and medicine can cause physical symptoms  similar to stress. Mental illness can cause emotional symptoms and unhealthy behaviors similar to stress. Your health care provider may refer you to a mental health professional for further evaluation.  TREATMENT  Stress management is the recommended treatment for stress.The goals of stress management are reducing stressful life events and coping with stress in healthy ways.  Techniques for reducing stressful life events include the following:  Stress identification. Self-monitor for stress and identify what causes stress for you. These skills may help you to avoid some stressful events.  Time management. Set your priorities, keep a calendar of events, and learn to say "no." These tools can help you avoid making too many commitments. Techniques for coping with stress include the following:  Rethinking the problem. Try to think realistically about stressful events rather than ignoring them or overreacting. Try to find the positives in a stressful situation rather than focusing on the negatives.  Exercise. Physical exercise can release both physical and emotional tension. The key is to find a form of exercise you enjoy and do it regularly.  Relaxation techniques. These relax the body and mind. Examples include yoga, meditation, tai chi, biofeedback, deep breathing, progressive muscle relaxation, listening to music, being out in nature, journaling, and other hobbies. Again, the key is to find one or more that you enjoy and can do regularly.  Healthy lifestyle. Eat a balanced diet, get plenty of sleep, and do not smoke. Avoid using alcohol or drugs to relax.  Strong support network. Spend time with family, friends, or other people you enjoy being around.Express your feelings and talk things over with someone you trust. Counseling or talktherapy with a mental health professional may be helpful if you are having difficulty managing stress on your own. Medicine is typically not recommended for the  treatment of stress.Talk to your health care provider if you think you need medicine for symptoms of stress. HOME CARE INSTRUCTIONS  Keep all follow-up visits as directed by your health care provider.  Take all medicines as directed by your health care provider. SEEK MEDICAL CARE IF:  Your symptoms get worse or you start having new symptoms.  You feel overwhelmed by your problems and can no longer manage them on your own. SEEK IMMEDIATE MEDICAL CARE IF:  You feel like hurting yourself or someone else.   This information is not intended to replace advice given to you by your health care provider. Make sure you discuss any questions you have with your health care provider.   Document Released: 11/22/2000 Document Revised: 06/19/2014 Document Reviewed: 01/21/2013 Elsevier Interactive Patient Education 2016 Dixon K. Panosh M.D.

## 2015-11-26 NOTE — Patient Instructions (Signed)
Decrease work  To 6 hours per day at this time Attend to healthy eating sleep  And acitivy .  ROV in July  About 13 14  To reasess to se how doing  And make further advice .    Stress and Stress Management Stress is a normal reaction to life events. It is what you feel when life demands more than you are used to or more than you can handle. Some stress can be useful. For example, the stress reaction can help you catch the last bus of the day, study for a test, or meet a deadline at work. But stress that occurs too often or for too long can cause problems. It can affect your emotional health and interfere with relationships and normal daily activities. Too much stress can weaken your immune system and increase your risk for physical illness. If you already have a medical problem, stress can make it worse. CAUSES  All sorts of life events may cause stress. An event that causes stress for one person may not be stressful for another person. Major life events commonly cause stress. These may be positive or negative. Examples include losing your job, moving into a new home, getting married, having a baby, or losing a loved one. Less obvious life events may also cause stress, especially if they occur day after day or in combination. Examples include working long hours, driving in traffic, caring for children, being in debt, or being in a difficult relationship. SIGNS AND SYMPTOMS Stress may cause emotional symptoms including, the following:  Anxiety. This is feeling worried, afraid, on edge, overwhelmed, or out of control.  Anger. This is feeling irritated or impatient.  Depression. This is feeling sad, down, helpless, or guilty.  Difficulty focusing, remembering, or making decisions. Stress may cause physical symptoms, including the following:   Aches and pains. These may affect your head, neck, back, stomach, or other areas of your body.  Tight muscles or clenched jaw.  Low energy or trouble  sleeping. Stress may cause unhealthy behaviors, including the following:   Eating to feel better (overeating) or skipping meals.  Sleeping too little, too much, or both.  Working too much or putting off tasks (procrastination).  Smoking, drinking alcohol, or using drugs to feel better. DIAGNOSIS  Stress is diagnosed through an assessment by your health care provider. Your health care provider will ask questions about your symptoms and any stressful life events.Your health care provider will also ask about your medical history and may order blood tests or other tests. Certain medical conditions and medicine can cause physical symptoms similar to stress. Mental illness can cause emotional symptoms and unhealthy behaviors similar to stress. Your health care provider may refer you to a mental health professional for further evaluation.  TREATMENT  Stress management is the recommended treatment for stress.The goals of stress management are reducing stressful life events and coping with stress in healthy ways.  Techniques for reducing stressful life events include the following:  Stress identification. Self-monitor for stress and identify what causes stress for you. These skills may help you to avoid some stressful events.  Time management. Set your priorities, keep a calendar of events, and learn to say "no." These tools can help you avoid making too many commitments. Techniques for coping with stress include the following:  Rethinking the problem. Try to think realistically about stressful events rather than ignoring them or overreacting. Try to find the positives in a stressful situation rather than focusing on the  negatives.  Exercise. Physical exercise can release both physical and emotional tension. The key is to find a form of exercise you enjoy and do it regularly.  Relaxation techniques. These relax the body and mind. Examples include yoga, meditation, tai chi, biofeedback, deep  breathing, progressive muscle relaxation, listening to music, being out in nature, journaling, and other hobbies. Again, the key is to find one or more that you enjoy and can do regularly.  Healthy lifestyle. Eat a balanced diet, get plenty of sleep, and do not smoke. Avoid using alcohol or drugs to relax.  Strong support network. Spend time with family, friends, or other people you enjoy being around.Express your feelings and talk things over with someone you trust. Counseling or talktherapy with a mental health professional may be helpful if you are having difficulty managing stress on your own. Medicine is typically not recommended for the treatment of stress.Talk to your health care provider if you think you need medicine for symptoms of stress. HOME CARE INSTRUCTIONS  Keep all follow-up visits as directed by your health care provider.  Take all medicines as directed by your health care provider. SEEK MEDICAL CARE IF:  Your symptoms get worse or you start having new symptoms.  You feel overwhelmed by your problems and can no longer manage them on your own. SEEK IMMEDIATE MEDICAL CARE IF:  You feel like hurting yourself or someone else.   This information is not intended to replace advice given to you by your health care provider. Make sure you discuss any questions you have with your health care provider.   Document Released: 11/22/2000 Document Revised: 06/19/2014 Document Reviewed: 01/21/2013 Elsevier Interactive Patient Education [employer].

## 2015-11-26 NOTE — Progress Notes (Signed)
Pre visit review using our clinic review tool, if applicable. No additional management support is needed unless otherwise documented below in the visit note. 

## 2015-12-22 NOTE — Progress Notes (Signed)
Document opened and reviewed for  visit . No showed .   

## 2015-12-23 ENCOUNTER — Ambulatory Visit (INDEPENDENT_AMBULATORY_CARE_PROVIDER_SITE_OTHER): Payer: BLUE CROSS/BLUE SHIELD | Admitting: Internal Medicine

## 2016-01-05 ENCOUNTER — Telehealth: Payer: Self-pay | Admitting: Internal Medicine

## 2016-01-05 NOTE — Telephone Encounter (Signed)
Patient Name: Angela Vang  DOB: [birth year]    Initial Comment Caller is having chest pain   Nurse Assessment  Nurse: Stefano Gaul, RN, Dwana Curd Date/Time (Eastern Time): 01/05/2016 2:05:36 PM  Confirm and document reason for call. If symptomatic, describe symptoms. You must click the next button to save text entered. ---Caller states she has a migraine and she is having dizziness and having chest pain. Has had chest pain for 3-4 days. Tender to the touch. Chest pain is in the middle of her chest. She is SOB.  Has the patient traveled out of the country within the last 30 days? ---Not Applicable  Does the patient have any new or worsening symptoms? ---Yes  Will a triage be completed? ---Yes  Related visit to physician within the last 2 weeks? ---No  Does the PT have any chronic conditions? (i.e. diabetes, asthma, etc.) ---Yes  List chronic conditions. ---migraines  Is the patient pregnant or possibly pregnant? (Ask all females between the ages of 66-55) ---No  Is this a behavioral health or substance abuse call? ---No     Guidelines    Guideline Title Affirmed Question Affirmed Notes  Chest Pain [1] Chest pain lasts > 5 minutes AND [2] age > 30 AND [3] at least one cardiac risk factor (i.e., hypertension, diabetes, obesity, smoker or strong family history of heart disease)    Final Disposition User   Call EMS 911 Now [place], RN, Vera    Comments  Pt states she does not want to call 911 or go to the ER.  Called back line and spoke to [facility] and gave report that pt is having chest pain, dizziness, SOB. Was advised to call 911 but she wants to come to the office.   Disagree/Comply: Disagree  Disagree/Comply Reason: Disagree with instructions

## 2016-01-05 NOTE — Telephone Encounter (Signed)
Left a message for a return call.

## 2016-01-06 ENCOUNTER — Telehealth: Payer: Self-pay | Admitting: Internal Medicine

## 2016-01-06 NOTE — Telephone Encounter (Signed)
Spoke to the pt.  She was seen at [facility].  She stated that she has a "chest sprain" from too much exercise and that the migraine and dizziness was from stress.  Transferred pt to scheduling to make post ed follow up.

## 2016-01-06 NOTE — Telephone Encounter (Signed)
Pt states the ED told her to follow up today, and she wants to make sure that is ok for her appt to be next week 8/3 ?

## 2016-01-07 NOTE — Telephone Encounter (Signed)
Probably  Ok  But I dont have the record  Please get them .   Or have them send to [place] on Care everywhere   If possible .  Get her on schedule  30 minutes next week .

## 2016-01-07 NOTE — Telephone Encounter (Signed)
Pt seen at [facility].  Please get record.

## 2016-01-13 ENCOUNTER — Ambulatory Visit: Payer: Self-pay | Admitting: Internal Medicine

## 2016-01-13 NOTE — Progress Notes (Signed)
Pre visit review using our clinic review tool, if applicable. No additional management support is needed unless otherwise documented below in the visit note.  Chief Complaint  Patient presents with  . Follow-up    HPI: Angela Vang 30 y.o.   comesin fru ed evaluation for chest pain    Seen in [place]     y   And depression and request for fmla time  stress  MIssed  NO show   her last appt fu  For  Stress and other sx  See last visit   Doesn't remember havaing a f appt  She is back to work 8 hours day and doing better however she had  dizziness  HA and Chest pain  And concern   And seen in ed  Neg enzymes x ray and ekg NS  Had migraine dix also  Given orral meds couldn t get her an IV or meds only lab abnormal alt 45 .   Better in tha tarea but she is very concerned about her inability to exercise over and above fatigue from stress and  Mom of young child  When she tries to exercise is ? SOB  ocass   Never right since child birth  No hx asthma has ocass cough  No leg edema hx of clotting.     Panic attacks at times  Noted as before counseling x1 no help . Med in remote past for PP depression sertraline uncertain if helped at al;; .   Can I sign form for work that she was out the days and after she went to the ed and now back to work ?   ROS: See pertinent positives and negatives per HPI. Some breeathing?  No vomiting wegiht loss  Sig edema   Past Medical History:  Diagnosis Date  . ADHD (attention deficit hyperactivity disorder)   . Anxiety   . Calculus of kidney   . Depression   . Herpes    cold sores no vag lesions  . History of chickenpox   . Hx of anorexia nervosa   . Hx of gonorrhea    age 25  . Hx of varicella   . Multiple pregnancy loss, not currently pregnant 08/05/2011   x 2 first trimester  sees dr Pennie Rushing   . Palpitations 03/27/2013  . Telogen hair loss     Family History  Problem Relation Age of Onset  . Diabetes Mother   . Hypertension Mother   . Depression  Father   . Bipolar disorder Father   . Leukemia Maternal Grandmother   . Mental illness Brother     suicide att  . Mental illness Paternal Grandfather     suicide att  . Breast cancer Maternal Aunt   . Irritable bowel syndrome Mother     Social History   Social History  . Marital status: Married    Spouse name: N/A  . Number of children: 1  . Years of education: N/A   Occupational History  . [employer]    Social History Main Topics  . Smoking status: Former Smoker    Types: Cigarettes    Quit date: 09/13/2012  . Smokeless tobacco: Never Used     Comment: occasional when drinking  . Alcohol use No  . Drug use: No  . Sexual activity: Yes   Other Topics Concern  . 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    30 yo goes to daycare   HH   3 husband and infant 2 dogs and a  Cat  hh of           Outpatient Medications Prior to Visit  Medication Sig Dispense Refill  . cetirizine (ZYRTEC) 10 MG tablet Take 10 mg by mouth daily.    . fluticasone (FLONASE) 50 MCG/ACT nasal spray Place 1 spray into both nostrils as needed for allergies or rhinitis.    . Multiple Vitamins-Minerals (MULTIVITAMIN PO) Take by mouth.    . norethindrone-ethinyl estradiol (JUNEL FE,GILDESS FE,LOESTRIN FE) 1-20 MG-MCG tablet Take 1 tablet by mouth daily.     No facility-administered medications prior to visit.      EXAM:  BP 104/64 (BP Location: Right Arm, Patient Position: Sitting, Cuff Size: Large)   Pulse 92   Temp 98.3 F (36.8 C) (Oral)   Wt 210 lb 9.6 oz (95.5 kg)   SpO2 98%   BMI 34.51 kg/m   Body mass index is 34.51 kg/m.  GENERAL: vitals reviewed and listed above, alert, oriented, appears well hydrated and in no acute distress  Emotional at times but cognition intact and   Articulate and not  depressed  HEENT: atraumatic, conjunctiva  clear, no obvious abnormalities on inspection of external nose and ears OP : no lesion edema or exudate  NECK: no obvious masses on inspection palpation  LUNGS: clear to auscultation bilaterally, no wheezes, rales or rhonchi, good air movement CV: HRRR, no clubbing cyanosis or  peripheral edema nl cap refill  Abdomen:  Sof,t normal bowel sounds without hepatosplenomegaly, no guarding rebound or masses no CVA tenderness MS: moves all extremities without noticeable focal  abnormality PSYCH: pleasant and cooperative,..Oriented x 3 and no noted deficits in memory, attention, and speech.   reports reviewed  And  Form completed and signed   ASSESSMENT AND PLAN:  Discussed the following assessment and plan:  Impaired exercise tolerance - Plan: Ambulatory referral to Cardiology, Pulmonary function test  Other fatigue  Anxiety state - poss panic attacks alos hx add  Stress  Attention deficit hyperactivity disorder (ADHD), combined type  Other chest pain - Plan: Ambulatory referral to Cardiology, Pulmonary function test Patient concern about inability to exercise over and above other sx   .   Disc  options  Will get cardiology referral ? If stress tsest or echo appropriate  Also will offer  pfts looking for asthmatic component to sx . PE seems unlikely with the  The onset context    Etc at this time  However is on ocps   Also disc cbt that may help her and trial of medication lexapro 5 increase to 10 mg and then ROV med eval   Few lorazepam can use with travel      Risk benefit of medication discussed. Cautions etc -Patient advised to return or notify health care team  if symptoms worsen ,persist or new concerns arise. Total visit 40 mins > 50% spent counseling and coordinating care as indicated in above note and in instructions to patient .   Patient Instructions  Exam and labs reassuring . You should be contacted about a cardiology referral  opino about  Decrease exercise tolerance and inability to progress in exercise as discussed .  Also pulmonary function testing  As discussed . Sometimes atypical asthma  Could do this also .   Begin  lexapro for panic anxiety suppression    rescues med   Lorazepam  As needed    ( dependent producing if taking regular over weeks )    Cognitive therapy still may help at some time in future.   Sometimes adhd and anxiety go together .   ROV in  About a month for med check or as needed     Neta Mends. Kaedance Magos M.D.

## 2016-01-14 ENCOUNTER — Ambulatory Visit (INDEPENDENT_AMBULATORY_CARE_PROVIDER_SITE_OTHER): Payer: BLUE CROSS/BLUE SHIELD | Admitting: Internal Medicine

## 2016-01-14 ENCOUNTER — Encounter: Payer: Self-pay | Admitting: Internal Medicine

## 2016-01-14 VITALS — BP 104/64 | HR 92 | Temp 98.3°F | Wt 210.6 lb

## 2016-01-14 DIAGNOSIS — F411 Generalized anxiety disorder: Secondary | ICD-10-CM | POA: Diagnosis not present

## 2016-01-14 DIAGNOSIS — R6889 Other general symptoms and signs: Secondary | ICD-10-CM | POA: Diagnosis not present

## 2016-01-14 DIAGNOSIS — R0789 Other chest pain: Secondary | ICD-10-CM

## 2016-01-14 DIAGNOSIS — F439 Reaction to severe stress, unspecified: Secondary | ICD-10-CM

## 2016-01-14 DIAGNOSIS — R5383 Other fatigue: Secondary | ICD-10-CM | POA: Diagnosis not present

## 2016-01-14 DIAGNOSIS — Z658 Other specified problems related to psychosocial circumstances: Secondary | ICD-10-CM | POA: Diagnosis not present

## 2016-01-14 DIAGNOSIS — F902 Attention-deficit hyperactivity disorder, combined type: Secondary | ICD-10-CM

## 2016-01-14 MED ORDER — ESCITALOPRAM OXALATE 10 MG PO TABS: ORAL_TABLET | ORAL | 3 refills | Status: DC

## 2016-01-14 MED ORDER — LORAZEPAM 0.5 MG PO TABS: ORAL_TABLET | ORAL | 1 refills | Status: DC

## 2016-01-14 NOTE — Patient Instructions (Signed)
Exam and labs reassuring . You should be contacted about a cardiology referral opino about  Decrease exercise tolerance and inability to progress in exercise as discussed .   Also pulmonary function testing  As discussed . Sometimes atypical asthma  Could do this also .   Begin  lexapro for panic anxiety suppression    rescues med   Lorazepam  As needed    ( dependent producing if taking regular over weeks )    Cognitive therapy still may help at some time in future.   Sometimes adhd and anxiety go together .   ROV in  About a month for med check or as needed

## 2016-02-10 ENCOUNTER — Encounter: Payer: Self-pay | Admitting: Adult Health

## 2016-02-10 ENCOUNTER — Ambulatory Visit (INDEPENDENT_AMBULATORY_CARE_PROVIDER_SITE_OTHER): Payer: BLUE CROSS/BLUE SHIELD | Admitting: Adult Health

## 2016-02-10 ENCOUNTER — Telehealth: Payer: Self-pay | Admitting: Internal Medicine

## 2016-02-10 VITALS — BP 112/70 | Temp 98.6°F | Ht 65.5 in | Wt 213.5 lb

## 2016-02-10 DIAGNOSIS — J0140 Acute pansinusitis, unspecified: Secondary | ICD-10-CM | POA: Diagnosis not present

## 2016-02-10 MED ORDER — DOXYCYCLINE HYCLATE 100 MG PO CAPS: 100.0000 mg | ORAL_CAPSULE | Freq: Two times a day (BID) | ORAL | 0 refills | Status: DC

## 2016-02-10 NOTE — Telephone Encounter (Signed)
Is this ok?

## 2016-02-10 NOTE — Telephone Encounter (Signed)
That is fine with me.

## 2016-02-10 NOTE — Patient Instructions (Signed)
It was great meeting you today.   Your exam is consistent with a sinus infection. I have sent in a prescription for doxycycline. Please take as directed.   Follow up if no improvement   General Recommendations:    Please drink plenty of fluids.  Get plenty of rest   Sleep in humidified air  Use saline nasal sprays  Netti pot   OTC Medications:  Decongestants - helps relieve congestion   Flonase (generic fluticasone) or Nasacort (generic triamcinolone) - please make sure to use the "cross-over" technique at a 45 degree angle towards the opposite eye as opposed to straight up the nasal passageway.   Sudafed (generic pseudoephedrine - Note this is the one that is available behind the pharmacy counter); Products with phenylephrine (-PE) may also be used but is often not as effective as pseudoephedrine.   If you have HIGH BLOOD PRESSURE - Coricidin HBP; AVOID any product that is -D as this contains pseudoephedrine which may increase your blood pressure.  Afrin (oxymetazoline) every 6-8 hours for up to 3 days.   Allergies - helps relieve runny nose, itchy eyes and sneezing   Claritin (generic loratidine), Allegra (fexofenidine), or Zyrtec (generic cyrterizine) for runny nose. These medications should not cause drowsiness.  Note - Benadryl (generic diphenhydramine) may be used however may cause drowsiness  Cough -   Delsym or Robitussin (generic dextromethorphan)  Expectorants - helps loosen mucus to ease removal   Mucinex (generic guaifenesin) as directed on the package.  Headaches / General Aches   Tylenol (generic acetaminophen) - DO NOT EXCEED 3 grams (3,000 mg) in a 24 hour time period  Advil/Motrin (generic ibuprofen)   Sore Throat -   Salt water gargle   Chloraseptic (generic benzocaine) spray or lozenges / Sucrets (generic dyclonine)    Sinusitis Sinusitis is redness, soreness, and inflammation of the paranasal sinuses. Paranasal sinuses are air  pockets within the bones of your face (beneath the eyes, the middle of the forehead, or above the eyes). In healthy paranasal sinuses, mucus is able to drain out, and air is able to circulate through them by way of your nose. However, when your paranasal sinuses are inflamed, mucus and air can become trapped. This can allow bacteria and other germs to grow and cause infection. Sinusitis can develop quickly and last only a short time (acute) or continue over a long period (chronic). Sinusitis that lasts for more than 12 weeks is considered chronic.  CAUSES  Causes of sinusitis include:  Allergies.  Structural abnormalities, such as displacement of the cartilage that separates your nostrils (deviated septum), which can decrease the air flow through your nose and sinuses and affect sinus drainage.  Functional abnormalities, such as when the small hairs (cilia) that line your sinuses and help remove mucus do not work properly or are not present. SIGNS AND SYMPTOMS  Symptoms of acute and chronic sinusitis are the same. The primary symptoms are pain and pressure around the affected sinuses. Other symptoms include:  Upper toothache.  Earache.  Headache.  Bad breath.  Decreased sense of smell and taste.  A cough, which worsens when you are lying flat.  Fatigue.  Fever.  Thick drainage from your nose, which often is green and may contain pus (purulent).  Swelling and warmth over the affected sinuses. DIAGNOSIS  Your health care provider will perform a physical exam. During the exam, your health care provider may:  Look in your nose for signs of abnormal growths in your nostrils (  nasal polyps).  Tap over the affected sinus to check for signs of infection.  View the inside of your sinuses (endoscopy) using an imaging device that has a light attached (endoscope). If your health care provider suspects that you have chronic sinusitis, one or more of the following tests may be  recommended:  Allergy tests.  Nasal culture. A sample of mucus is taken from your nose, sent to a lab, and screened for bacteria.  Nasal cytology. A sample of mucus is taken from your nose and examined by your health care provider to determine if your sinusitis is related to an allergy. TREATMENT  Most cases of acute sinusitis are related to a viral infection and will resolve on their own within 10 days. Sometimes medicines are prescribed to help relieve symptoms (pain medicine, decongestants, nasal steroid sprays, or saline sprays).  However, for sinusitis related to a bacterial infection, your health care provider will prescribe antibiotic medicines. These are medicines that will help kill the bacteria causing the infection.  Rarely, sinusitis is caused by a fungal infection. In theses cases, your health care provider will prescribe antifungal medicine. For some cases of chronic sinusitis, surgery is needed. Generally, these are cases in which sinusitis recurs more than 3 times per year, despite other treatments. HOME CARE INSTRUCTIONS   Drink plenty of water. Water helps thin the mucus so your sinuses can drain more easily.  Use a humidifier.  Inhale steam 3 to 4 times a day (for example, sit in the bathroom with the shower running).  Apply a warm, moist washcloth to your face 3 to 4 times a day, or as directed by your health care provider.  Use saline nasal sprays to help moisten and clean your sinuses.  Take medicines only as directed by your health care provider.  If you were prescribed either an antibiotic or antifungal medicine, finish it all even if you start to feel better. SEEK IMMEDIATE MEDICAL CARE IF:  You have increasing pain or severe headaches.  You have nausea, vomiting, or drowsiness.  You have swelling around your face.  You have vision problems.  You have a stiff neck.  You have difficulty breathing. MAKE SURE YOU:   Understand these  instructions.  Will watch your condition.  Will get help right away if you are not doing well or get worse. Document Released: 05/29/2005 Document Revised: 10/13/2013 Document Reviewed: 06/13/2011 [facility] Patient Information 2015 [place], [place]. This information is not intended to replace advice given to you by your health care provider. Make sure you discuss any questions you have with your health care provider.

## 2016-02-10 NOTE — Telephone Encounter (Signed)
Pt want to see Na[place] and change form dr.Panosh

## 2016-02-10 NOTE — Progress Notes (Signed)
Subjective:    Patient ID: Angela GottronAmanda L Vang, female    DOB: Nov 24, 1985, 30 y.o.   MRN: [id number]  Sinusitis  This is a new problem. The current episode started in the past 7 days. There has been no fever. Associated symptoms include congestion, coughing, diaphoresis, headaches, sinus pressure and a sore throat. Pertinent negatives include no chills. Past treatments include oral decongestants and spray decongestants (Afrin ). The treatment provided no relief.    Review of Systems  Constitutional: Positive for diaphoresis. Negative for activity change, appetite change, chills, fatigue and fever.  HENT: Positive for congestion, postnasal drip, rhinorrhea, sinus pressure and sore throat. Negative for nosebleeds, trouble swallowing and voice change.   Eyes: Negative.   Respiratory: Positive for cough.   Cardiovascular: Negative.   Musculoskeletal: Negative.   Neurological: Positive for headaches.   Past Medical History:  Diagnosis Date  . ADHD (attention deficit hyperactivity disorder)   . Anxiety   . Calculus of kidney   . Depression   . Herpes    cold sores no vag lesions  . History of chickenpox   . Hx of anorexia nervosa   . Hx of gonorrhea    age 30  . Hx of varicella   . Multiple pregnancy loss, not currently pregnant 08/05/2011   x 2 first trimester  sees dr Pennie RushingHaygood   . Palpitations 03/27/2013  . Telogen hair loss     Social History   Social History  . Marital status: Married    Spouse name: N/A  . Number of children: 1  . Years of education: N/A   Occupational History  . [employer]    Social History Main Topics  . Smoking status: Former Smoker    Types: Cigarettes    Quit date: 09/13/2012  . Smokeless tobacco: Never Used     Comment: occasional when drinking  . Alcohol use No  . Drug use: No  . Sexual activity: Yes   Other Topics Concern  . 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    30 yo goes to daycare   HH   3 husband and infant 2 dogs and a  Cat  hh of           Past Surgical History:  Procedure Laterality Date  . CESAREAN SECTION N/A 05/22/2013   Procedure: Primary Cesarean Section Delivery Baby Girl @ 2137, Apgars 8/9;  Surgeon: Zelphia CairoGretchen Adkins, MD;  Location: [facility];  Service: Obstetrics;  Laterality: N/A;  . DILATION AND CURETTAGE OF UTERUS    . KIDNEY STONE SURGERY     with stent  . WISDOM TOOTH EXTRACTION      Family History  Problem Relation Age of Onset  . Diabetes Mother   . Hypertension Mother   . Depression Father   . Bipolar disorder Father   . Leukemia Maternal Grandmother   . Mental illness Brother     suicide att  . Mental illness Paternal Grandfather     suicide att  . Breast cancer Maternal Aunt   . Irritable bowel syndrome Mother     No Known Allergies  Current Outpatient Prescriptions on File Prior to Visit  Medication Sig Dispense Refill  .  cetirizine (ZYRTEC) 10 MG tablet Take 10 mg by mouth daily.    . fluticasone (FLONASE) 50 MCG/ACT nasal spray Place 1 spray into both nostrils as needed for allergies or rhinitis.    Marland Kitchen LORazepam (ATIVAN) 0.5 MG tablet 1-2 prn anxiety attack up to twice a day 12 tablet 1  . Multiple Vitamins-Minerals (MULTIVITAMIN PO) Take by mouth.    . norethindrone-ethinyl estradiol (JUNEL FE,GILDESS FE,LOESTRIN FE) 1-20 MG-MCG tablet Take 1 tablet by mouth daily.     No current facility-administered medications on file prior to visit.     BP 112/70   Temp 98.6 F (37 C) (Oral)   Ht 5' 5.5" (1.664 m)   Wt 213 lb 8 oz (96.8 kg)   BMI 34.99 kg/m       Objective:   Physical Exam  Constitutional: She is oriented to person, place, and time. She appears well-developed and well-nourished. No distress.  HENT:  Right Ear: Hearing, external ear and ear canal normal. Tympanic  membrane is bulging. Tympanic membrane is not erythematous.  Left Ear: Hearing, external ear and ear canal normal. Tympanic membrane is bulging. Tympanic membrane is not erythematous.  Nose: Mucosal edema and rhinorrhea present. Right sinus exhibits maxillary sinus tenderness and frontal sinus tenderness. Left sinus exhibits maxillary sinus tenderness and frontal sinus tenderness.  Mouth/Throat: Uvula is midline and mucous membranes are normal. Mucous membranes are not pale and not dry. No oropharyngeal exudate, posterior oropharyngeal edema, posterior oropharyngeal erythema or tonsillar abscesses.  Cardiovascular: Normal rate, regular rhythm, normal heart sounds and intact distal pulses.  Exam reveals no gallop and no friction rub.   No murmur heard. Pulmonary/Chest: Effort normal and breath sounds normal. No respiratory distress. She has no wheezes. She has no rales.  Neurological: She is alert and oriented to person, place, and time.  Skin: Skin is warm and dry. No rash noted. She is not diaphoretic. No erythema. No pallor.  Psychiatric: She has a normal mood and affect. Her behavior is normal. Judgment and thought content normal.  Nursing note and vitals reviewed.     Assessment & Plan:  1. Acute pansinusitis, recurrence not specified - doxycycline (VIBRAMYCIN) 100 MG capsule; Take 1 capsule (100 mg total) by mouth 2 (two) times daily.  Dispense: 14 capsule; Refill: 0 - Can use Flonase  - Stay hydrated and rest - Follow up if no improvement  Shirline Frees, NP

## 2016-02-11 NOTE — Telephone Encounter (Signed)
Call pt and left a message

## 2016-02-14 NOTE — Telephone Encounter (Signed)
Ok with me  But   Kandee KeenCory  Needs to opine if he wants to cancel  or  proceed with the referrals  Related to her concern     fatigue and exercise intolerance  As per last visit  And take action as he sees fit  .   Thanks   [facility]

## 2016-02-17 ENCOUNTER — Ambulatory Visit: Payer: Self-pay | Admitting: Internal Medicine

## 2016-03-08 ENCOUNTER — Encounter (INDEPENDENT_AMBULATORY_CARE_PROVIDER_SITE_OTHER): Payer: BLUE CROSS/BLUE SHIELD | Admitting: Internal Medicine

## 2016-03-08 DIAGNOSIS — R6889 Other general symptoms and signs: Secondary | ICD-10-CM | POA: Diagnosis not present

## 2016-03-08 DIAGNOSIS — R0789 Other chest pain: Secondary | ICD-10-CM

## 2016-03-08 LAB — PULMONARY FUNCTION TEST
DL/VA % pred: 105 %
DL/VA: 5.25 ml/min/mmHg/L
DLCO cor % pred: 94 %
DLCO cor: 24.77 ml/min/mmHg
DLCO unc % pred: 95 %
DLCO unc: 25 ml/min/mmHg
FEF 25-75 Post: 4.77 L/sec
FEF 25-75 Pre: 2.98 L/sec
FEF2575-%Change-Post: 60 %
FEF2575-%Pred-Post: 134 %
FEF2575-%Pred-Pre: 83 %
FEV1-%Change-Post: 13 %
FEV1-%Pred-Post: 95 %
FEV1-%Pred-Pre: 83 %
FEV1-Post: 3.2 L
FEV1-Pre: 2.8 L
FEV1FVC-%Change-Post: 4 %
FEV1FVC-%Pred-Pre: 97 %
FEV6-%Change-Post: 8 %
FEV6-%Pred-Post: 94 %
FEV6-%Pred-Pre: 86 %
FEV6-Post: 3.7 L
FEV6-Pre: 3.4 L
FEV6FVC-%Pred-Post: 101 %
FEV6FVC-%Pred-Pre: 101 %
FVC-%Change-Post: 8 %
FVC-%Pred-Post: 93 %
FVC-%Pred-Pre: 85 %
FVC-Post: 3.7 L
FVC-Pre: 3.4 L
Post FEV1/FVC ratio: 86 %
Post FEV6/FVC ratio: 100 %
Pre FEV1/FVC ratio: 82 %
Pre FEV6/FVC Ratio: 100 %
RV % pred: 84 %
RV: 1.23 L
TLC % pred: 92 %
TLC: 4.91 L

## 2016-03-10 ENCOUNTER — Ambulatory Visit (INDEPENDENT_AMBULATORY_CARE_PROVIDER_SITE_OTHER): Payer: BLUE CROSS/BLUE SHIELD | Admitting: Adult Health

## 2016-03-10 DIAGNOSIS — J4520 Mild intermittent asthma, uncomplicated: Secondary | ICD-10-CM | POA: Diagnosis not present

## 2016-03-10 DIAGNOSIS — J45909 Unspecified asthma, uncomplicated: Secondary | ICD-10-CM | POA: Insufficient documentation

## 2016-03-10 MED ORDER — ALBUTEROL SULFATE 108 (90 BASE) MCG/ACT IN AEPB: 2.0000 | INHALATION_SPRAY | Freq: Four times a day (QID) | RESPIRATORY_TRACT | 2 refills | Status: DC | PRN

## 2016-03-10 NOTE — Patient Instructions (Addendum)
It was great seeing you today!  You pulmonary function tests showed " minimal asthma-like findings"   I have called in a Respi-Click Albuteral inhaler. Print off the savings card to see if you can get a discount on this.   Only use this when you feel SOB or wheezy.   Let me know if you are using it often    Asthma, Adult Asthma is a condition of the lungs in which the airways tighten and narrow. Asthma can make it hard to breathe. Asthma cannot be cured, but medicine and lifestyle changes can help control it. Asthma may be started (triggered) by:  Animal skin flakes (dander).  Dust.  Cockroaches.  Pollen.  Mold.  Smoke.  Cleaning products.  Hair sprays or aerosol sprays.  Paint fumes or strong smells.  Cold air, weather changes, and winds.  Crying or laughing hard.  Stress.  Certain medicines or drugs.  Foods, such as dried fruit, potato chips, and sparkling grape juice.  Infections or conditions (colds, flu).  Exercise.  Certain medical conditions or diseases.  Exercise or tiring activities. HOME CARE   Take medicine as told by your doctor.  Use a peak flow meter as told by your doctor. A peak flow meter is a tool that measures how well the lungs are working.  Record and keep track of the peak flow meter's readings.  Understand and use the asthma action plan. An asthma action plan is a written plan for taking care of your asthma and treating your attacks.  To help prevent asthma attacks:  Do not smoke. Stay away from secondhand smoke.  Change your heating and air conditioning filter often.  Limit your use of fireplaces and wood stoves.  Get rid of pests (such as roaches and mice) and their droppings.  Throw away plants if you see mold on them.  Clean your floors. Dust regularly. Use cleaning products that do not smell.  Have someone vacuum when you are not home. Use a vacuum cleaner with a HEPA filter if possible.  Replace carpet with wood,  tile, or vinyl flooring. Carpet can trap animal skin flakes and dust.  Use allergy-proof pillows, mattress covers, and box spring covers.  Wash bed sheets and blankets every week in hot water and dry them in a dryer.  Use blankets that are made of polyester or cotton.  Clean bathrooms and kitchens with bleach. If possible, have someone repaint the walls in these rooms with mold-resistant paint. Keep out of the rooms that are being cleaned and painted.  Wash hands often. GET HELP IF:  You have make a whistling sound when breaking (wheeze), have shortness of breath, or have a cough even if taking medicine to prevent attacks.  The colored mucus you cough up (sputum) is thicker than usual.  The colored mucus you cough up changes from clear or white to yellow, green, gray, or bloody.  You have problems from the medicine you are taking such as:  A rash.  Itching.  Swelling.  Trouble breathing.  You need reliever medicines more than 2-3 times a week.  Your peak flow measurement is still at 50-79% of your personal best after following the action plan for 1 hour.  You have a fever. GET HELP RIGHT AWAY IF:   You seem to be worse and are not responding to medicine during an asthma attack.  You are short of breath even at rest.  You get short of breath when doing very little activity.  You have trouble eating, drinking, or talking.  You have chest pain.  You have a fast heartbeat.  Your lips or fingernails start to turn blue.  You are light-headed, dizzy, or faint.  Your peak flow is less than 50% of your personal best.   This information is not intended to replace advice given to you by your health care provider. Make sure you discuss any questions you have with your health care provider.   Document Released: 11/15/2007 Document Revised: 02/17/2015 Document Reviewed: 12/26/2012 Elsevier Interactive Patient Education [employer]2016 Elsevier Inc.

## 2016-03-10 NOTE — Progress Notes (Signed)
Subjective:    Patient ID: Angela Vang, female    DOB: 11-09-1985, 30 y.o.   MRN: [id number]  HPI  30 year old female who presents to the office today for follow up after PFT which showed " minimal asthma-like symptoms". She reports that she has SOB with exercise but is not sure if this is due to her sedentary life style and body habitus. She does not always feel wheezy when exercising.   She is not currently complaining of SOB. She is here to go over her results.   Review of Systems  Constitutional: Negative.   HENT: Negative.   Respiratory: Negative.   Cardiovascular: Negative.   Neurological: Negative.   All other systems reviewed and are negative.  Past Medical History:  Diagnosis Date  . ADHD (attention deficit hyperactivity disorder)   . Anxiety   . Calculus of kidney   . Depression   . Herpes    cold sores no vag lesions  . History of chickenpox   . Hx of anorexia nervosa   . Hx of gonorrhea    age 30  . Hx of varicella   . Multiple pregnancy loss, not currently pregnant 08/05/2011   x 2 first trimester  sees dr Pennie RushingHaygood   . Palpitations 03/27/2013  . Telogen hair loss     Social History   Social History  . Marital status: Married    Spouse name: N/A  . Number of children: 1  . Years of education: N/A   Occupational History  . [employer]    Social History Main Topics  . Smoking status: Former Smoker    Types: Cigarettes    Quit date: 09/13/2012  . Smokeless tobacco: Never Used     Comment: occasional when drinking  . Alcohol use No  . Drug use: No  . Sexual activity: Yes   Other Topics Concern  . 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    30 yo goes to daycare   HH   3 husband and infant 2 dogs and a  Cat  hh of           Past Surgical History:    Procedure Laterality Date  . CESAREAN SECTION N/A 05/22/2013   Procedure: Primary Cesarean Section Delivery Baby Girl @ 2137, Apgars 8/9;  Surgeon: Zelphia CairoGretchen Adkins, MD;  Location: [facility];  Service: Obstetrics;  Laterality: N/A;  . DILATION AND CURETTAGE OF UTERUS    . KIDNEY STONE SURGERY     with stent  . WISDOM TOOTH EXTRACTION      Family History  Problem Relation Age of Onset  . Diabetes Mother   . Hypertension Mother   . Depression Father   . Bipolar disorder Father   . Leukemia Maternal Grandmother   . Mental illness Brother     suicide att  . Mental illness Paternal Grandfather     suicide att  . Breast cancer Maternal Aunt   . Irritable bowel syndrome Mother     No Known Allergies  Current Outpatient Prescriptions on File Prior to Visit  Medication Sig Dispense Refill  . cetirizine (ZYRTEC) 10 MG tablet Take 10 mg by mouth daily.    .Marland Kitchen  fluticasone (FLONASE) 50 MCG/ACT nasal spray Place 1 spray into both nostrils as needed for allergies or rhinitis.    Marland Kitchen LORazepam (ATIVAN) 0.5 MG tablet 1-2 prn anxiety attack up to twice a day 12 tablet 1  . Multiple Vitamins-Minerals (MULTIVITAMIN PO) Take by mouth.    . norethindrone-ethinyl estradiol (JUNEL FE,GILDESS FE,LOESTRIN FE) 1-20 MG-MCG tablet Take 1 tablet by mouth daily.     No current facility-administered medications on file prior to visit.     BP 124/82   Temp 98.2 F (36.8 C) (Oral)   Ht 5' 5.5" (1.664 m)   Wt 213 lb 4.8 oz (96.8 kg)   BMI 34.96 kg/m       Objective:   Physical Exam  Constitutional: She appears well-developed and well-nourished. No distress.  HENT:  Head: Normocephalic and atraumatic.  Right Ear: External ear normal.  Left Ear: External ear normal.  Nose: Nose normal.  Mouth/Throat: Oropharynx is clear and moist. No oropharyngeal exudate.  Eyes: Conjunctivae and EOM are normal. Pupils are equal, round, and reactive to light.  Cardiovascular: Normal rate, regular rhythm, normal heart  sounds and intact distal pulses.  Exam reveals no gallop.   No murmur heard. Pulmonary/Chest: Effort normal and breath sounds normal. No respiratory distress. She has no wheezes. She has no rales. She exhibits no tenderness.  Neurological: She is alert.  Skin: Skin is warm and dry. No rash noted. She is not diaphoretic. No erythema. No pallor.  Psychiatric: She has a normal mood and affect. Her behavior is normal. Judgment and thought content normal.  Nursing note and vitals reviewed.     Assessment & Plan:  1. Asthma, mild intermittent, uncomplicated - reviewed PFT's with patient. Will prescribe rescue inhaler. She was advise me if she is using this on a consistent basis.  - Albuterol Sulfate (PROAIR RESPICLICK) 108 (90 Base) MCG/ACT AEPB; Inhale 2 puffs into the lungs every 6 (six) hours as needed.  Dispense: 1 each; Refill: 2   Shirline Frees, NP

## 2016-03-16 ENCOUNTER — Ambulatory Visit: Payer: Self-pay | Admitting: Adult Health

## 2016-04-11 ENCOUNTER — Ambulatory Visit: Payer: Self-pay | Admitting: Adult Health

## 2016-04-14 ENCOUNTER — Ambulatory Visit: Payer: Self-pay | Admitting: Adult Health

## 2016-04-17 ENCOUNTER — Ambulatory Visit: Payer: BLUE CROSS/BLUE SHIELD | Admitting: Cardiovascular Disease

## 2016-04-25 ENCOUNTER — Ambulatory Visit: Payer: BLUE CROSS/BLUE SHIELD | Admitting: Cardiovascular Disease

## 2016-04-25 ENCOUNTER — Ambulatory Visit: Payer: Self-pay | Admitting: Adult Health

## 2016-05-29 ENCOUNTER — Encounter: Payer: Self-pay | Admitting: *Deleted

## 2016-05-29 ENCOUNTER — Ambulatory Visit: Payer: BLUE CROSS/BLUE SHIELD | Admitting: Cardiovascular Disease

## 2016-05-29 NOTE — Progress Notes (Deleted)
Cardiology Office Note   Date:  05/29/2016   ID:  Angela Gottronmanda L Kreuser, DOB 1986/05/12, MRN [id number]  PCP:  Shirline Freesory Nafziger, NP  Cardiologist:   Chilton Siiffany Darbydale, MD   No chief complaint on file.     History of Present Illness: Angela Vang is a 30 y.o. female with ADHD and asthma who presents for an evaluation of shortness of breath. Ms. Valorie Rooseveltroctor saw Shirline Freesory Nafziger, NP on 03/10/16. She reported exertional shortness of breath and chest pain.    Past Medical History:  Diagnosis Date  . ADHD (attention deficit hyperactivity disorder)   . Anxiety   . Calculus of kidney   . Depression   . Herpes    cold sores no vag lesions  . History of chickenpox   . Hx of anorexia nervosa   . Hx of gonorrhea    age 30  . Hx of varicella   . Multiple pregnancy loss, not currently pregnant 08/05/2011   x 2 first trimester  sees dr Pennie RushingHaygood   . Palpitations 03/27/2013  . Telogen hair loss     Past Surgical History:  Procedure Laterality Date  . CESAREAN SECTION N/A 05/22/2013   Procedure: Primary Cesarean Section Delivery Baby Girl @ 2137, Apgars 8/9;  Surgeon: Zelphia CairoGretchen Adkins, MD;  Location: [facility];  Service: Obstetrics;  Laterality: N/A;  . DILATION AND CURETTAGE OF UTERUS    . KIDNEY STONE SURGERY     with stent  . WISDOM TOOTH EXTRACTION       Current Outpatient Prescriptions  Medication Sig Dispense Refill  . Albuterol Sulfate (PROAIR RESPICLICK) 108 (90 Base) MCG/ACT AEPB Inhale 2 puffs into the lungs every 6 (six) hours as needed. 1 each 2  . cetirizine (ZYRTEC) 10 MG tablet Take 10 mg by mouth daily.    . fluticasone (FLONASE) 50 MCG/ACT nasal spray Place 1 spray into both nostrils as needed for allergies or rhinitis.    Marland Kitchen. LORazepam (ATIVAN) 0.5 MG tablet 1-2 prn anxiety attack up to twice a day 12 tablet 1  . Multiple Vitamins-Minerals (MULTIVITAMIN PO) Take by mouth.    . norethindrone-ethinyl estradiol (JUNEL FE,GILDESS FE,LOESTRIN FE) 1-20 MG-MCG tablet Take 1 tablet by  mouth daily.     No current facility-administered medications for this visit.     Allergies:   Patient has no known allergies.    Social History:  The patient  reports that she quit smoking about 3 years ago. Her smoking use included Cigarettes. She has never used smokeless tobacco. She reports that she does not drink alcohol or use drugs.   Family History:  The patient's ***family history includes Bipolar disorder in her father; Breast cancer in her maternal aunt; Depression in her father; Diabetes in her mother; Hypertension in her mother; Irritable bowel syndrome in her mother; Leukemia in her maternal grandmother; Mental illness in her brother and paternal grandfather.    ROS:  Please see the history of present illness.   Otherwise, review of systems are positive for {NONE DEFAULTED:18576::"none"}.   All other systems are reviewed and negative.    PHYSICAL EXAM: VS:  There were no vitals taken for this visit. , BMI There is no height or weight on file to calculate BMI. GENERAL:  Well appearing HEENT:  Pupils equal round and reactive, fundi not visualized, oral mucosa unremarkable NECK:  No jugular venous distention, waveform within normal limits, carotid upstroke brisk and symmetric, no bruits, no thyromegaly LYMPHATICS:  No cervical adenopathy LUNGS:  Clear to auscultation bilaterally HEART:  RRR.  PMI not displaced or sustained,S1 and S2 within normal limits, no S3, no S4, no clicks, no rubs, *** murmurs ABD:  Flat, positive bowel sounds normal in frequency in pitch, no bruits, no rebound, no guarding, no midline pulsatile mass, no hepatomegaly, no splenomegaly EXT:  2 plus pulses throughout, no edema, no cyanosis no clubbing SKIN:  No rashes no nodules NEURO:  Cranial nerves II through XII grossly intact, motor grossly intact throughout PSYCH:  Cognitively intact, oriented to person place and time    EKG:  EKG {ACTION; IS/IS ZOX:[id number]}OT:21021397} ordered today. The ekg ordered today  demonstrates ***   Recent Labs: 07/15/2015: ALT 26; BUN 10; Creatinine, Ser 0.69; Hemoglobin 13.7; Platelets 243.0; Potassium 4.7; Sodium 139; TSH 1.44    Lipid Panel    Component Value Date/Time   CHOL 183 07/15/2015 0904   TRIG 156.0 (H) 07/15/2015 0904   HDL 56.20 07/15/2015 0904   CHOLHDL 3 07/15/2015 0904   VLDL 31.2 07/15/2015 0904   LDLCALC 95 07/15/2015 0904      Wt Readings from Last 3 Encounters:  03/10/16 96.8 kg (213 lb 4.8 oz)  02/10/16 96.8 kg (213 lb 8 oz)  01/14/16 95.5 kg (210 lb 9.6 oz)      ASSESSMENT AND PLAN:  ***   Current medicines are reviewed at length with the patient today.  The patient {ACTIONS; HAS/DOES NOT HAVE:19233} concerns regarding medicines.  The following changes have been made:  {PLAN; NO CHANGE:13088:s}  Labs/ tests ordered today include: *** No orders of the defined types were placed in this encounter.    Disposition:   FU with ***    This note was written with the assistance of speech recognition software.  Please excuse any transcriptional errors.  Signed, Carthel Castille C. Duke Salviaandolph, MD, [facility] BangorFACC  05/29/2016 7:51 AM    [facility]

## 2016-07-07 ENCOUNTER — Ambulatory Visit (INDEPENDENT_AMBULATORY_CARE_PROVIDER_SITE_OTHER): Payer: BLUE CROSS/BLUE SHIELD | Admitting: Adult Health

## 2016-07-07 ENCOUNTER — Encounter: Payer: Self-pay | Admitting: Adult Health

## 2016-07-07 VITALS — BP 130/76 | Temp 98.4°F | Ht 65.5 in | Wt 217.2 lb

## 2016-07-07 DIAGNOSIS — J029 Acute pharyngitis, unspecified: Secondary | ICD-10-CM

## 2016-07-07 DIAGNOSIS — J0101 Acute recurrent maxillary sinusitis: Secondary | ICD-10-CM

## 2016-07-07 DIAGNOSIS — H1032 Unspecified acute conjunctivitis, left eye: Secondary | ICD-10-CM | POA: Diagnosis not present

## 2016-07-07 LAB — POCT RAPID STREP A (OFFICE): Rapid Strep A Screen: NEGATIVE

## 2016-07-07 MED ORDER — DOXYCYCLINE HYCLATE 100 MG PO CAPS: 100.0000 mg | ORAL_CAPSULE | Freq: Two times a day (BID) | ORAL | 0 refills | Status: DC

## 2016-07-07 MED ORDER — ERYTHROMYCIN 5 MG/GM OP OINT: TOPICAL_OINTMENT | OPHTHALMIC | 0 refills | Status: DC

## 2016-07-07 NOTE — Progress Notes (Signed)
Subjective:    Patient ID: Angela Vang, female    DOB: May 28, 1986, 31 y.o.   MRN: [id number]  Sinusitis  This is a new problem. The current episode started 1 to 4 weeks ago (2 weeks). The problem is unchanged. There has been no fever. Associated symptoms include congestion, coughing (non productive), headaches, sinus pressure, a sore throat and swollen glands. Pertinent negatives include no chills, ear pain or shortness of breath. Treatments tried: Afrin and Dayquil  The treatment provided mild relief.  Sore Throat   This is a new problem. The current episode started in the past 7 days. The problem has been unchanged. Neither side of throat is experiencing more pain than the other. There has been no fever. Associated symptoms include congestion, coughing (non productive), headaches and swollen glands. Pertinent negatives include no ear pain, shortness of breath or trouble swallowing. She has had exposure to strep. She has tried acetaminophen for the symptoms. The treatment provided mild relief.  Conjunctivitis   The current episode started 2 days ago. The onset was sudden. The problem has been unchanged. The problem is mild. Nothing relieves the symptoms. Associated symptoms include eye itching, congestion, headaches, rhinorrhea, sore throat, swollen glands, cough (non productive), eye discharge and eye redness. Pertinent negatives include no photophobia, no ear pain and no wheezing. The left eye is affected. The eyelid exhibits redness.      Review of Systems  Constitutional: Negative for chills.  HENT: Positive for congestion, postnasal drip, rhinorrhea, sinus pain, sinus pressure and sore throat. Negative for ear pain, facial swelling and trouble swallowing.   Eyes: Positive for discharge, redness and itching. Negative for photophobia and visual disturbance.  Respiratory: Positive for cough (non productive). Negative for shortness of breath and wheezing.   Cardiovascular: Negative.     Gastrointestinal: Negative.   Neurological: Positive for headaches.   Past Medical History:  Diagnosis Date  . ADHD (attention deficit hyperactivity disorder)   . Anxiety   . Calculus of kidney   . Depression   . Herpes    cold sores no vag lesions  . History of chickenpox   . Hx of anorexia nervosa   . Hx of gonorrhea    age 2  . Hx of varicella   . Multiple pregnancy loss, not currently pregnant 08/05/2011   x 2 first trimester  sees dr Pennie Rushing   . Palpitations 03/27/2013  . Telogen hair loss     Social History   Social History  . Marital status: Married    Spouse name: N/A  . Number of children: 1  . Years of education: N/A   Occupational History  . [employer]    Social History Main Topics  . Smoking status: Former Smoker    Types: Cigarettes    Quit date: 09/13/2012  . Smokeless tobacco: Never Used     Comment: occasional when drinking  . Alcohol use No  . Drug use: No  . Sexual activity: Yes   Other Topics Concern  . 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    31 yo goes to daycare   [facility]   3 husband and infant 2 dogs and a  Cat  hh of           Past Surgical History:  Procedure Laterality Date  . CESAREAN SECTION N/A 05/22/2013   Procedure: Primary Cesarean Section Delivery Baby Girl @ 2137, Apgars 8/9;  Surgeon: Zelphia Cairo, MD;  Location: [facility];  Service: Obstetrics;  Laterality: N/A;  . DILATION AND CURETTAGE OF UTERUS    . KIDNEY STONE SURGERY     with stent  . WISDOM TOOTH EXTRACTION      Family History  Problem Relation Age of Onset  . Diabetes Mother   . Hypertension Mother   . Depression Father   . Bipolar disorder Father   . Leukemia Maternal Grandmother   . Mental illness Brother     suicide att  . Mental illness Paternal Grandfather      suicide att  . Breast cancer Maternal Aunt   . Irritable bowel syndrome Mother     No Known Allergies  Current Outpatient Prescriptions on File Prior to Visit  Medication Sig Dispense Refill  . cetirizine (ZYRTEC) 10 MG tablet Take 10 mg by mouth daily.    . fluticasone (FLONASE) 50 MCG/ACT nasal spray Place 1 spray into both nostrils as needed for allergies or rhinitis.    Marland Kitchen LORazepam (ATIVAN) 0.5 MG tablet 1-2 prn anxiety attack up to twice a day 12 tablet 1  . Multiple Vitamins-Minerals (MULTIVITAMIN PO) Take by mouth.    . norethindrone-ethinyl estradiol (JUNEL FE,GILDESS FE,LOESTRIN FE) 1-20 MG-MCG tablet Take 1 tablet by mouth daily.     No current facility-administered medications on file prior to visit.     BP 130/76   Temp 98.4 F (36.9 C) (Oral)   Ht 5' 5.5" (1.664 m)   Wt 217 lb 3.2 oz (98.5 kg)   BMI 35.59 kg/m       Objective:   Physical Exam  Constitutional: She is oriented to person, place, and time. She appears well-developed and well-nourished. No distress.  HENT:  Right Ear: Hearing, tympanic membrane, external ear and ear canal normal. Tympanic membrane is not erythematous and not bulging.  Left Ear: Hearing, tympanic membrane, external ear and ear canal normal. Tympanic membrane is not erythematous and not bulging.  Nose: Rhinorrhea present. Right sinus exhibits maxillary sinus tenderness. Right sinus exhibits no frontal sinus tenderness. Left sinus exhibits maxillary sinus tenderness. Left sinus exhibits no frontal sinus tenderness.  Mouth/Throat: Uvula is midline and mucous membranes are normal. Posterior oropharyngeal edema (hypertrophic tonsils ) present. No oropharyngeal exudate or tonsillar abscesses.  Eyes: EOM are normal. Pupils are equal, round, and reactive to light. Right eye exhibits no discharge. Left eye exhibits discharge. Right conjunctiva is not injected. Right conjunctiva has no hemorrhage. Left conjunctiva is injected. Left conjunctiva  has no hemorrhage.  Cardiovascular: Normal rate, regular rhythm, normal heart sounds and intact distal pulses.  Exam reveals no gallop and no friction rub.   No murmur heard. Pulmonary/Chest: Effort normal and breath sounds normal. No respiratory distress. She has no wheezes. She has no rales. She exhibits no tenderness.  Musculoskeletal: Normal range of motion. She exhibits no edema, tenderness or deformity.  Neurological: She is alert and oriented to person, place, and time.  Skin: Skin is warm and dry. No rash noted. She is not diaphoretic. No erythema. No pallor.  Psychiatric: She has a normal mood and affect. Her behavior is normal. Judgment and thought content normal.  Nursing note and vitals reviewed.     Assessment & Plan:  1. Sore throat  -  POC Rapid Strep A- negative   2. Acute bacterial conjunctivitis of left eye  - erythromycin (ROMYCIN) ophthalmic ointment; Apply thin ribbon to affected eye(s) once daily for 7 days.  Dispense: 1 g; Refill: 0  3. Acute recurrent maxillary sinusitis  - doxycycline (VIBRAMYCIN) 100 MG capsule; Take 1 capsule (100 mg total) by mouth 2 (two) times daily.  Dispense: 20 capsule; Refill: 0 - Flonase and Mucinex - Follow up if no improvement in the next 2-3 days   Shirline Freesory Casmira Cramer, NP

## 2016-07-14 ENCOUNTER — Ambulatory Visit: Payer: Self-pay | Admitting: Adult Health

## 2016-07-26 ENCOUNTER — Telehealth: Payer: Self-pay | Admitting: Adult Health

## 2016-07-26 ENCOUNTER — Other Ambulatory Visit: Payer: Self-pay

## 2016-07-26 MED ORDER — LORAZEPAM 0.5 MG PO TABS: ORAL_TABLET | ORAL | 1 refills | Status: DC

## 2016-07-26 NOTE — Telephone Encounter (Signed)
See below and advise

## 2016-07-26 NOTE — Telephone Encounter (Signed)
Pt request refill   LORazepam (ATIVAN) 0.5 MG tablet  Pt states she only takes when she has to fly, and her grandfather died.  Pt has to get on a plane tomorrow and hopes she can get enough to get her through this time.  Lasted refilled by Dr Fabian SharpPanosh  [facility] [phone]#[id number] - [place], [place] - [address]

## 2016-07-26 NOTE — Telephone Encounter (Signed)
Rx has been called in  

## 2016-07-26 NOTE — Telephone Encounter (Signed)
I am sorry to hear that.   Ok to refill last prescription

## 2016-09-26 ENCOUNTER — Encounter: Payer: Self-pay | Admitting: Family Medicine

## 2016-09-26 ENCOUNTER — Ambulatory Visit (INDEPENDENT_AMBULATORY_CARE_PROVIDER_SITE_OTHER): Payer: BLUE CROSS/BLUE SHIELD | Admitting: Family Medicine

## 2016-09-26 ENCOUNTER — Ambulatory Visit: Payer: BLUE CROSS/BLUE SHIELD | Admitting: Adult Health

## 2016-09-26 VITALS — BP 120/80 | HR 84 | Temp 98.1°F | Wt 220.4 lb

## 2016-09-26 DIAGNOSIS — H9312 Tinnitus, left ear: Secondary | ICD-10-CM | POA: Diagnosis not present

## 2016-09-26 DIAGNOSIS — H905 Unspecified sensorineural hearing loss: Secondary | ICD-10-CM | POA: Diagnosis not present

## 2016-09-26 DIAGNOSIS — R519 Headache, unspecified: Secondary | ICD-10-CM

## 2016-09-26 DIAGNOSIS — R51 Headache: Secondary | ICD-10-CM

## 2016-09-26 DIAGNOSIS — R42 Dizziness and giddiness: Secondary | ICD-10-CM | POA: Diagnosis not present

## 2016-09-26 NOTE — Progress Notes (Signed)
Subjective:     Patient ID: Angela Vang, female   DOB: 17-Apr-1986, 31 y.o.   MRN: [id number]  HPI Patient seen with left ear symptoms. She actually went to urgent care this past Sunday and was diagnosed with "acute supperative otitis ". She was placed on amoxicillin but never started taking this and was also given prednisone 40 mg daily.  Symptoms have not improved on prednisone. She gives history that for the past week she has had fairly constant ringing only in the left ear. She has also had intermittent episodes of total hearing loss in the left ear. She also relates that she has intermittent throbbing type headache just on the left side. She has never had migraine headaches. Denies any nausea or vomiting. She also has some intermittent lightheadedness but no true vertigo. Those symptoms come and go. No clear exacerbating factors for her headache.  No visual symptoms.  Denies any recent nasal congestion. No fevers or chills.  She has noted that loud noises seem to exacerbate her ringing. No aspirin use. No history of chemotherapy. Does not use any regular prescription medications.  Past Medical History:  Diagnosis Date  . ADHD (attention deficit hyperactivity disorder)   . Anxiety   . Calculus of kidney   . Depression   . Herpes    cold sores no vag lesions  . History of chickenpox   . Hx of anorexia nervosa   . Hx of gonorrhea    age 46  . Hx of varicella   . Multiple pregnancy loss, not currently pregnant 08/05/2011   x 2 first trimester  sees dr Pennie Rushing   . Palpitations 03/27/2013  . Telogen hair loss    Past Surgical History:  Procedure Laterality Date  . CESAREAN SECTION N/A 05/22/2013   Procedure: Primary Cesarean Section Delivery Baby Girl @ 2137, Apgars 8/9;  Surgeon: Zelphia Cairo, MD;  Location: [facility];  Service: Obstetrics;  Laterality: N/A;  . DILATION AND CURETTAGE OF UTERUS    . KIDNEY STONE SURGERY     with stent  . WISDOM TOOTH EXTRACTION      reports that  she quit smoking about 4 years ago. Her smoking use included Cigarettes. She has never used smokeless tobacco. She reports that she does not drink alcohol or use drugs. family history includes Bipolar disorder in her father; Breast cancer in her maternal aunt; Depression in her father; Diabetes in her mother; Hypertension in her mother; Irritable bowel syndrome in her mother; Leukemia in her maternal grandmother; Mental illness in her brother and paternal grandfather. Allergies  Allergen Reactions  . Amoxicillin Itching     Review of Systems  Constitutional: Negative for appetite change, chills, fever and unexpected weight change.  HENT: Positive for ear pain and hearing loss. Negative for ear discharge.   Eyes: Negative for pain and visual disturbance.  Respiratory: Negative for shortness of breath.   Cardiovascular: Negative for chest pain.  Gastrointestinal: Negative for abdominal pain.  Neurological: Positive for dizziness and light-headedness. Negative for weakness.       Objective:   Physical Exam  Constitutional: She is oriented to person, place, and time. She appears well-developed and well-nourished.  HENT:  Right Ear: External ear normal.  Mouth/Throat: Oropharynx is clear and moist.  Minimal erythema of the left canal but eardrum appears normal. No effusion. No bulging. No significant redness.  Weber -lateralizes to the right ear Rinne-normal pattern right ear and left ear air conduction greater than bone conduction  Neurological: She is alert and oriented to person, place, and time. No cranial nerve deficit. Coordination normal.  No focal strength deficits. Gait normal.       Assessment:     Patient presents with unilateral tinnitus, intermittent headache (which is also unilateral-left), intermittent loss of hearing on the left side. She does not have any evidence for effusion or infection or any evidence for conductive hearing loss. She also does not have pattern of  conductive loss as per testing above. She has evidence for sensorineural loss on the left side Given that she has atypical any lateral headache, intermittent hearing loss on the left, and unilateral ringing needs further evaluation.    Plan:     -MRI brain to rule out pressure on the eighth cranial nerve -Consider ENT referral if the above is normal  Kristian Covey MD [facility] at [facility]

## 2016-09-26 NOTE — Progress Notes (Signed)
Pre visit review using our clinic review tool, if applicable. No additional management support is needed unless otherwise documented below in the visit note. 

## 2016-09-26 NOTE — Patient Instructions (Signed)
We will set up MRI to further assess your ringing and hearing loss.

## 2016-09-29 ENCOUNTER — Encounter: Payer: Self-pay | Admitting: Family Medicine

## 2016-09-29 ENCOUNTER — Ambulatory Visit (INDEPENDENT_AMBULATORY_CARE_PROVIDER_SITE_OTHER): Payer: BLUE CROSS/BLUE SHIELD | Admitting: Family Medicine

## 2016-09-29 VITALS — BP 104/70 | HR 90 | Temp 98.3°F | Ht 65.5 in | Wt 221.9 lb

## 2016-09-29 DIAGNOSIS — H905 Unspecified sensorineural hearing loss: Secondary | ICD-10-CM

## 2016-09-29 DIAGNOSIS — H9202 Otalgia, left ear: Secondary | ICD-10-CM | POA: Diagnosis not present

## 2016-09-29 DIAGNOSIS — H9312 Tinnitus, left ear: Secondary | ICD-10-CM

## 2016-09-29 NOTE — Progress Notes (Signed)
HPI:  Severe L ear pain/Tinnitus/hearing loss/Headache: -started acutely about 1 week ago -see notes from another provider here 2 days ago with, Sensorineural loss on L per notes -she reports worsening symptoms and pain, L ear/face/neck, ringing and echoing in ear, hearing loss, headache to the point "it is making me crazy" -MRI scheduled for 1.5 weeks from now -reports she can't sleep, work or do anything due to severity symptoms  ROS: See pertinent positives and negatives per HPI.  Past Medical History:  Diagnosis Date  . ADHD (attention deficit hyperactivity disorder)   . Anxiety   . Calculus of kidney   . Depression   . Herpes    cold sores no vag lesions  . History of chickenpox   . Hx of anorexia nervosa   . Hx of gonorrhea    age 31  . Hx of varicella   . Multiple pregnancy loss, not currently pregnant 08/05/2011   x 2 first trimester  sees dr Pennie Rushing   . Palpitations 03/27/2013  . Telogen hair loss     Past Surgical History:  Procedure Laterality Date  . CESAREAN SECTION N/A 05/22/2013   Procedure: Primary Cesarean Section Delivery Baby Girl @ 2137, Apgars 8/9;  Surgeon: Zelphia Cairo, MD;  Location: [facility];  Service: Obstetrics;  Laterality: N/A;  . DILATION AND CURETTAGE OF UTERUS    . KIDNEY STONE SURGERY     with stent  . WISDOM TOOTH EXTRACTION      Family History  Problem Relation Age of Onset  . Diabetes Mother   . Hypertension Mother   . Depression Father   . Bipolar disorder Father   . Leukemia Maternal Grandmother   . Mental illness Brother     suicide att  . Mental illness Paternal Grandfather     suicide att  . Breast cancer Maternal Aunt   . Irritable bowel syndrome Mother     Social History   Social History  . Marital status: Married    Spouse name: N/A  . Number of children: 1  . Years of education: N/A   Occupational History  . [employer]    Social History Main Topics  . Smoking status: Former Smoker    Types:  Cigarettes    Quit date: 09/13/2012  . Smokeless tobacco: Never Used     Comment: occasional when drinking  . Alcohol use No  . Drug use: No  . Sexual activity: Yes   Other Topics Concern  . 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    31 yo goes to daycare   HH   3 husband and infant 2 dogs and a  Cat  hh of            Current Outpatient Prescriptions:  .  cetirizine (ZYRTEC) 10 MG tablet, Take 10 mg by mouth daily., Disp: , Rfl:  .  fluticasone (FLONASE) 50 MCG/ACT nasal spray, Place 1 spray into both nostrils as needed for allergies or rhinitis., Disp: , Rfl:  .  LORazepam (ATIVAN) 0.5 MG tablet, 1-2 prn anxiety attack up to twice a day, Disp: 12 tablet, Rfl: 1 .  Multiple Vitamins-Minerals (MULTIVITAMIN PO), Take by mouth., Disp: , Rfl:  .  predniSONE (DELTASONE) 20 MG tablet, , Disp: , Rfl:   EXAM:  Vitals:   09/29/16 1421  BP: 104/70  Pulse: 90  Temp: 98.3 F (36.8 C)    Body mass index is 36.36 kg/m.  GENERAL: vitals reviewed and listed above, alert, oriented, appears well hydrated and in no acute distress  HEENT: atraumatic, she is tearful and crying, no obvious abnormalities on inspection of external nose and ears, normal appearance of ear canals and TMs except for mild erythema in L ear canal, hearing grossly decreased L, no sig pain with tugging tragus/palpation around ear/temple, normal inspection oropharynx  NECK: no obvious masses on inspection, no bruit, normal ROM head and neck  LUNGS: clear to auscultation bilaterally, no wheezes, rales or rhonchi, good air movement  CV: HRRR, no peripheral edema  MS: moves all extremities without noticeable abnormality  PSYCH/neuro: tearful, uncomfortable, see very throughout hearing eval from a few days ago, hearing grossly reduced L,  O/w CN II-XII grossly intact, finger to nose normal, normal speech and thought processing,   ASSESSMENT AND PLAN:  Discussed the following assessment and plan:  Sensory hearing loss - Plan: Ambulatory referral to ENT  Tinnitus of left ear - Plan: Ambulatory referral to ENT  Ear pain, left - Plan: Ambulatory referral to ENT  -we discussed possible serious and likely etiologies, workup and treatment, treatment risks and return precautions - ? Viral/vascular/infection vs other lesion -I feel needs stat ENT evaluation and imaging with MRI - will try to get her in with ENT today for further eval or send her to the hospital for stat MRI and evaluation there -able to get her in with ENT for further eval - going straight to that appointment from here -of course, we advised Ledonna  to return or notify a doctor immediately if symptoms worsen or persist or new concerns arise.    There are no Patient Instructions on file for this visit.  Kriste Basque R., DO

## 2016-09-29 NOTE — Progress Notes (Signed)
Pre visit review using our clinic review tool, if applicable. No additional management support is needed unless otherwise documented below in the visit note. 

## 2016-10-02 ENCOUNTER — Telehealth: Payer: Self-pay | Admitting: Adult Health

## 2016-10-02 NOTE — Telephone Encounter (Signed)
Error pt made appt

## 2016-10-03 ENCOUNTER — Ambulatory Visit (INDEPENDENT_AMBULATORY_CARE_PROVIDER_SITE_OTHER): Payer: BLUE CROSS/BLUE SHIELD | Admitting: Adult Health

## 2016-10-03 ENCOUNTER — Encounter: Payer: Self-pay | Admitting: Adult Health

## 2016-10-03 VITALS — BP 134/80 | Ht 65.5 in | Wt 219.1 lb

## 2016-10-03 DIAGNOSIS — H905 Unspecified sensorineural hearing loss: Secondary | ICD-10-CM | POA: Diagnosis not present

## 2016-10-03 DIAGNOSIS — R42 Dizziness and giddiness: Secondary | ICD-10-CM

## 2016-10-03 MED ORDER — ONDANSETRON HCL 4 MG PO TABS: 4.0000 mg | ORAL_TABLET | Freq: Three times a day (TID) | ORAL | 0 refills | Status: DC | PRN

## 2016-10-03 MED ORDER — MECLIZINE HCL 25 MG PO TABS: 25.0000 mg | ORAL_TABLET | Freq: Three times a day (TID) | ORAL | 0 refills | Status: DC | PRN

## 2016-10-03 NOTE — Progress Notes (Signed)
Subjective:    Patient ID: Angela Vang, female    DOB: 05/04/1986, 31 y.o.   MRN: [id number]  HPI  31 year old female who  has a past medical history of ADHD (attention deficit hyperactivity disorder); Anxiety; Calculus of kidney; Depression; Herpes; History of chickenpox; anorexia nervosa; gonorrhea; varicella; Multiple pregnancy loss, not currently pregnant (08/05/2011); Palpitations (03/27/2013); and Telogen hair loss.  She has been seen by Dr. Caryl Never and Dr. Selena Batten over the last two weeks with the complaint of constant ringing in the left ear, intermittent episodes of total hearing loss in the left ear and a throbbing headache on the left side. Dr. Selena Batten was able to get her into ENT, four days ago and she reports " they didn't see anything and they want me to do a hearing test."  She has a MRI scheduled for this Sunday.   Today in the office she is tearful throughout much of the exam. She complains of worsening symptoms including dizziness " like the whole world is spinning, that is worse with position changes,  lightheadedness, and trouble driving. She also reports that she has some intermittent issues with double vision over the course of the last 3 days. Additionally, she complains of nausea from the dizziness.   Review of Systems See HPI   Past Medical History:  Diagnosis Date  . ADHD (attention deficit hyperactivity disorder)   . Anxiety   . Calculus of kidney   . Depression   . Herpes    cold sores no vag lesions  . History of chickenpox   . Hx of anorexia nervosa   . Hx of gonorrhea    age 83  . Hx of varicella   . Multiple pregnancy loss, not currently pregnant 08/05/2011   x 2 first trimester  sees dr Pennie Rushing   . Palpitations 03/27/2013  . Telogen hair loss     Social History   Social History  . Marital status: Married    Spouse name: N/A  . Number of children: 1  . Years of education: N/A   Occupational History  . [employer]    Social History Main  Topics  . Smoking status: Former Smoker    Types: Cigarettes    Quit date: 09/13/2012  . Smokeless tobacco: Never Used     Comment: occasional when drinking  . Alcohol use No  . Drug use: No  . Sexual activity: Yes   Other Topics Concern  . 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    31 yo goes to daycare   HH   3 husband and infant 2 dogs and a  Cat  hh of           Past Surgical History:  Procedure Laterality Date  . CESAREAN SECTION N/A 05/22/2013   Procedure: Primary Cesarean Section Delivery Baby Girl @ 2137, Apgars 8/9;  Surgeon: Zelphia Cairo, MD;  Location: [facility];  Service: Obstetrics;  Laterality: N/A;  . DILATION AND CURETTAGE OF UTERUS    . KIDNEY STONE SURGERY     with stent  . WISDOM TOOTH EXTRACTION      Family History  Problem Relation Age of Onset  . Diabetes  Mother   . Hypertension Mother   . Depression Father   . Bipolar disorder Father   . Leukemia Maternal Grandmother   . Mental illness Brother     suicide att  . Mental illness Paternal Grandfather     suicide att  . Breast cancer Maternal Aunt   . Irritable bowel syndrome Mother     Allergies  Allergen Reactions  . Amoxicillin Itching    Current Outpatient Prescriptions on File Prior to Visit  Medication Sig Dispense Refill  . cetirizine (ZYRTEC) 10 MG tablet Take 10 mg by mouth daily.    . fluticasone (FLONASE) 50 MCG/ACT nasal spray Place 1 spray into both nostrils as needed for allergies or rhinitis.    Marland Kitchen LORazepam (ATIVAN) 0.5 MG tablet 1-2 prn anxiety attack up to twice a day 12 tablet 1  . Multiple Vitamins-Minerals (MULTIVITAMIN PO) Take by mouth.     No current facility-administered medications on file prior to visit.     BP 134/80   Ht 5' 5.5" (1.664 m)   Wt 219 lb 1.6 oz (99.4 kg)   BMI  35.91 kg/m       Objective:   Physical Exam  Constitutional: She is oriented to person, place, and time.  HENT:  Right Ear: Tympanic membrane is not erythematous and not bulging.  Left Ear: Tympanic membrane is not erythematous and not bulging.  Eyes: Conjunctivae are normal. Pupils are equal, round, and reactive to light. Right eye exhibits no discharge. Left eye exhibits no discharge. No scleral icterus. Right eye exhibits nystagmus (horizontal ). Left eye exhibits nystagmus (horizontal ).  Cardiovascular: Normal rate, regular rhythm, normal heart sounds and intact distal pulses.  Exam reveals no gallop and no friction rub.   No murmur heard. Pulmonary/Chest: Effort normal and breath sounds normal. No respiratory distress. She has no wheezes. She has no rales.  Neurological: She is alert and oriented to person, place, and time.  Skin: Skin is warm and dry. No rash noted. No erythema. No pallor.  Psychiatric: She has a normal mood and affect. Her behavior is normal. Judgment and thought content normal.  Nursing note and vitals reviewed.     Assessment & Plan:  1. Sensory hearing loss - Was able to move up MRI to tomorrow at 6 pm  - MR MRA Head/Brain Wo Cm; Future  2. Dizziness - ondansetron (ZOFRAN) 4 MG tablet; Take 1 tablet (4 mg total) by mouth every 8 (eight) hours as needed for nausea or vomiting.  Dispense: 20 tablet; Refill: 0 - meclizine (ANTIVERT) 25 MG tablet; Take 1 tablet (25 mg total) by mouth 3 (three) times daily as needed for dizziness.  Dispense: 30 tablet; Refill: 0  Shirline Frees, NP

## 2016-10-04 ENCOUNTER — Ambulatory Visit
Admission: RE | Admit: 2016-10-04 | Discharge: 2016-10-04 | Disposition: A | Discharge status: Home / Self Care | Payer: BLUE CROSS/BLUE SHIELD | Source: Ambulatory Visit | Attending: Family Medicine | Admitting: Family Medicine

## 2016-10-04 DIAGNOSIS — R519 Headache, unspecified: Secondary | ICD-10-CM

## 2016-10-04 DIAGNOSIS — R51 Headache: Secondary | ICD-10-CM

## 2016-10-04 DIAGNOSIS — H9312 Tinnitus, left ear: Secondary | ICD-10-CM

## 2016-10-06 ENCOUNTER — Other Ambulatory Visit: Payer: Self-pay | Admitting: Adult Health

## 2016-10-06 MED ORDER — LORAZEPAM 0.5 MG PO TABS: 0.5000 mg | ORAL_TABLET | Freq: Two times a day (BID) | ORAL | 0 refills | Status: AC

## 2016-10-08 ENCOUNTER — Other Ambulatory Visit: Payer: Self-pay

## 2016-11-14 ENCOUNTER — Ambulatory Visit (INDEPENDENT_AMBULATORY_CARE_PROVIDER_SITE_OTHER): Payer: BLUE CROSS/BLUE SHIELD | Admitting: Adult Health

## 2016-11-14 ENCOUNTER — Encounter: Payer: Self-pay | Admitting: Adult Health

## 2016-11-14 VITALS — BP 118/72 | Ht 65.5 in | Wt 222.9 lb

## 2016-11-14 DIAGNOSIS — H8102 Meniere's disease, left ear: Secondary | ICD-10-CM | POA: Diagnosis not present

## 2016-11-14 NOTE — Progress Notes (Signed)
Subjective:    Patient ID: Angela Vang, female    DOB: Mar 01, 1986, 31 y.o.   MRN: [id number]  HPI  31 year old female presents to the office today for follow up with suspected Meniere's Diease. She first presented to the office on 09/26/2016 with the complaint of constant ringing in her left ear, intermittent episodes of otal hearing loss and a left sided throbbing headache. She was able to get into ENT and they wanted her to have a hearing test.   She also had a MRI done which was " normal"   I had prescribed her Ativan to take for a short time and she reported that her symptoms resolved. She has not taken any Ativan for about 3-4 weeks. Recently over this week the ringing in her left ear has started again and she is having episodes of dizziness and nausea. Reports that it is not as bad as when she first had it.   Review of Systems See HPI   Past Medical History:  Diagnosis Date  . ADHD (attention deficit hyperactivity disorder)   . Anxiety   . Calculus of kidney   . Depression   . Herpes    cold sores no vag lesions  . History of chickenpox   . Hx of anorexia nervosa   . Hx of gonorrhea    age 44  . Hx of varicella   . Multiple pregnancy loss, not currently pregnant 08/05/2011   x 2 first trimester  sees dr Pennie Rushing   . Palpitations 03/27/2013  . Telogen hair loss     Social History   Social History  . Marital status: Married    Spouse name: N/A  . Number of children: 1  . Years of education: N/A   Occupational History  . [employer]    Social History Main Topics  . Smoking status: Former Smoker    Types: Cigarettes    Quit date: 09/13/2012  . Smokeless tobacco: Never Used     Comment: occasional when drinking  . Alcohol use No  . Drug use: No  . Sexual activity: Yes   Other Topics Concern  . 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    31 yo goes to daycare   HH   3 husband and infant 2 dogs and a  Cat  hh of           Past Surgical History:  Procedure Laterality Date  . CESAREAN SECTION N/A 05/22/2013   Procedure: Primary Cesarean Section Delivery Baby Girl @ 2137, Apgars 8/9;  Surgeon: Zelphia Cairo, MD;  Location: [facility];  Service: Obstetrics;  Laterality: N/A;  . DILATION AND CURETTAGE OF UTERUS    . KIDNEY STONE SURGERY     with stent  . WISDOM TOOTH EXTRACTION      Family History  Problem Relation Age of Onset  . Diabetes Mother   . Hypertension Mother   . Depression Father   . Bipolar disorder Father   . Leukemia Maternal Grandmother   . Mental illness Brother        suicide att  . Mental illness Paternal Grandfather        suicide att  .  Breast cancer Maternal Aunt   . Irritable bowel syndrome Mother     Allergies  Allergen Reactions  . Amoxicillin Itching    Current Outpatient Prescriptions on File Prior to Visit  Medication Sig Dispense Refill  . cetirizine (ZYRTEC) 10 MG tablet Take 10 mg by mouth daily.    . fluticasone (FLONASE) 50 MCG/ACT nasal spray Place 1 spray into both nostrils as needed for allergies or rhinitis.    Marland Kitchen. meclizine (ANTIVERT) 25 MG tablet Take 1 tablet (25 mg total) by mouth 3 (three) times daily as needed for dizziness. 30 tablet 0  . Multiple Vitamins-Minerals (MULTIVITAMIN PO) Take by mouth.    . ondansetron (ZOFRAN) 4 MG tablet Take 1 tablet (4 mg total) by mouth every 8 (eight) hours as needed for nausea or vomiting. 20 tablet 0   No current facility-administered medications on file prior to visit.     BP 118/72 (BP Location: Left Arm, Patient Position: Sitting, Cuff Size: Normal)   Ht 5' 5.5" (1.664 m)   Wt 222 lb 14.4 oz (101.1 kg)   BMI 36.53 kg/m       Objective:   Physical Exam  Constitutional: She is oriented to person, place, and time. She appears well-developed  and well-nourished. No distress.  HENT:  Head: Normocephalic and atraumatic.  Right Ear: External ear normal.  Left Ear: External ear normal.  Nose: Nose normal.  Mouth/Throat: Oropharynx is clear and moist. No oropharyngeal exudate.  Eyes: Conjunctivae and EOM are normal. Pupils are equal, round, and reactive to light. Right eye exhibits no discharge. Left eye exhibits no discharge.  Cardiovascular: Normal rate, regular rhythm, normal heart sounds and intact distal pulses.  Exam reveals no gallop and no friction rub.   No murmur heard. Pulmonary/Chest: Effort normal and breath sounds normal. No respiratory distress. She has no wheezes. She has no rales. She exhibits no tenderness.  Neurological: She is alert and oriented to person, place, and time.  Skin: Skin is warm and dry. No rash noted. She is not diaphoretic. No erythema. No pallor.  Psychiatric: She has a normal mood and affect. Her behavior is normal. Judgment and thought content normal.  Nursing note and vitals reviewed.     Assessment & Plan:  1. Meniere's disease of left ear - Ok to restart current dose of Ativan. Will refer to ENT and Vestibular rehab  - PT vestibular rehab; Future - Ambulatory referral to ENT  Shirline Freesory Leylanie Woodmansee, NP

## 2016-11-15 ENCOUNTER — Encounter: Payer: Self-pay | Admitting: Adult Health

## 2017-01-09 ENCOUNTER — Ambulatory Visit (INDEPENDENT_AMBULATORY_CARE_PROVIDER_SITE_OTHER): Payer: BLUE CROSS/BLUE SHIELD | Admitting: Adult Health

## 2017-01-09 ENCOUNTER — Encounter: Payer: Self-pay | Admitting: Adult Health

## 2017-01-09 VITALS — BP 124/84 | Temp 98.6°F | Wt 220.0 lb

## 2017-01-09 DIAGNOSIS — H8102 Meniere's disease, left ear: Secondary | ICD-10-CM

## 2017-01-09 MED ORDER — LORAZEPAM 0.5 MG PO TABS: 0.5000 mg | ORAL_TABLET | Freq: Two times a day (BID) | ORAL | 0 refills | Status: DC

## 2017-01-09 NOTE — Progress Notes (Signed)
Subjective:    Patient ID: Angela GottronAmanda L Vang, female    DOB: March 25, 1986, 31 y.o.   MRN: [id number]  HPI   31 year old female who  has a past medical history of ADHD (attention deficit hyperactivity disorder); Anxiety; Calculus of kidney; Depression; Herpes; History of chickenpox; anorexia nervosa; gonorrhea; varicella; Multiple pregnancy loss, not currently pregnant (08/05/2011); Palpitations (03/27/2013); and Telogen hair loss.  She presents to the office today for return of symptoms of Meniere's Disease. She reports that her symptoms returned while on a cruise about a month ago. Since that time she has had constant dizziness and nausea. She again feels as though her symptoms are on the left side.   We had tied ativan to help alleviate symptoms in the past and she responded well to this.   She would like to see another ENT besides Dr. Ezzard StandingNewman.    Review of Systems See HPI   Past Medical History:  Diagnosis Date  . ADHD (attention deficit hyperactivity disorder)   . Anxiety   . Calculus of kidney   . Depression   . Herpes    cold sores no vag lesions  . History of chickenpox   . Hx of anorexia nervosa   . Hx of gonorrhea    age 31  . Hx of varicella   . Multiple pregnancy loss, not currently pregnant 08/05/2011   x 2 first trimester  sees dr Pennie RushingHaygood   . Palpitations 03/27/2013  . Telogen hair loss     Social History   Social History  . Marital status: Married    Spouse name: N/A  . Number of children: 1  . Years of education: N/A   Occupational History  . [employer]    Social History Main Topics  . Smoking status: Former Smoker    Types: Cigarettes    Quit date: 09/13/2012  . Smokeless tobacco: Never Used     Comment: occasional when drinking  . Alcohol use No  . Drug use: No  . Sexual activity: Yes   Other Topics Concern  . 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    31 yo goes to daycare   HH   3 husband and infant 2 dogs and a  Cat  hh of           Past Surgical History:  Procedure Laterality Date  . CESAREAN SECTION N/A 05/22/2013   Procedure: Primary Cesarean Section Delivery Baby Girl @ 2137, Apgars 8/9;  Surgeon: Zelphia CairoGretchen Adkins, MD;  Location: [facility];  Service: Obstetrics;  Laterality: N/A;  . DILATION AND CURETTAGE OF UTERUS    . KIDNEY STONE SURGERY     with stent  . WISDOM TOOTH EXTRACTION      Family History  Problem Relation Age of Onset  . Diabetes Mother   . Hypertension Mother   . Depression Father   . Bipolar disorder Father   . Leukemia Maternal Grandmother   . Mental illness Brother        suicide att  . Mental illness Paternal Grandfather        suicide att  . Breast cancer Maternal Aunt   . Irritable bowel syndrome Mother  Allergies  Allergen Reactions  . Amoxicillin Itching    Current Outpatient Prescriptions on File Prior to Visit  Medication Sig Dispense Refill  . cetirizine (ZYRTEC) 10 MG tablet Take 10 mg by mouth daily.    . fluticasone (FLONASE) 50 MCG/ACT nasal spray Place 1 spray into both nostrils as needed for allergies or rhinitis.    . Multiple Vitamins-Minerals (MULTIVITAMIN PO) Take by mouth.    . meclizine (ANTIVERT) 25 MG tablet Take 1 tablet (25 mg total) by mouth 3 (three) times daily as needed for dizziness. (Patient not taking: Reported on 01/09/2017) 30 tablet 0   No current facility-administered medications on file prior to visit.     BP 124/84 (BP Location: Left Arm)   Temp 98.6 F (37 C) (Oral)   Wt 220 lb (99.8 kg)   BMI 36.05 kg/m       Objective:   Physical Exam  Constitutional: She is oriented to person, place, and time. She appears well-developed and well-nourished. No distress.  HENT:  Head: Normocephalic and atraumatic.  Right Ear: External ear normal.  Left Ear:  External ear normal.  Nose: Nose normal.  Mouth/Throat: Oropharynx is clear and moist. No oropharyngeal exudate.  Eyes: Pupils are equal, round, and reactive to light. Conjunctivae and EOM are normal. Right eye exhibits no discharge. Left eye exhibits no discharge.  Cardiovascular: Normal rate, regular rhythm, normal heart sounds and intact distal pulses.  Exam reveals no gallop and no friction rub.   No murmur heard. Pulmonary/Chest: Effort normal and breath sounds normal. No respiratory distress. She has no wheezes. She has no rales. She exhibits no tenderness.  Neurological: She is alert and oriented to person, place, and time.  Skin: Skin is warm and dry. No rash noted. She is not diaphoretic. No erythema. No pallor.  Psychiatric: She has a normal mood and affect. Her behavior is normal.  Nursing note and vitals reviewed.     Assessment & Plan:  1. Meniere's disease of left ear - Will start her on Ativan as this seemed to help the last time  - LORazepam (ATIVAN) 0.5 MG tablet; Take 1 tablet (0.5 mg total) by mouth 2 (two) times daily.  Dispense: 60 tablet; Refill: 0 - Ambulatory referral to ENT - Dr. Pollyann Kennedyosen - Can consider referral to neurology   Shirline Freesory Arihaan Bellucci, NP

## 2017-01-16 ENCOUNTER — Telehealth: Payer: Self-pay | Admitting: Adult Health

## 2017-01-16 NOTE — Telephone Encounter (Signed)
-  Sick Verification form -Call [phone] upon completion

## 2017-03-27 ENCOUNTER — Encounter: Payer: Self-pay | Admitting: Adult Health

## 2017-03-27 ENCOUNTER — Ambulatory Visit (INDEPENDENT_AMBULATORY_CARE_PROVIDER_SITE_OTHER): Payer: BLUE CROSS/BLUE SHIELD | Admitting: Adult Health

## 2017-03-27 VITALS — BP 120/74 | HR 112 | Temp 98.2°F | Wt 220.0 lb

## 2017-03-27 DIAGNOSIS — R058 Other specified cough: Secondary | ICD-10-CM

## 2017-03-27 DIAGNOSIS — R05 Cough: Secondary | ICD-10-CM

## 2017-03-27 MED ORDER — BENZONATATE 200 MG PO CAPS: 200.0000 mg | ORAL_CAPSULE | Freq: Three times a day (TID) | ORAL | 2 refills | Status: DC | PRN

## 2017-03-27 MED ORDER — HYDROCODONE-HOMATROPINE 5-1.5 MG/5ML PO SYRP: 5.0000 mL | ORAL_SOLUTION | Freq: Three times a day (TID) | ORAL | 0 refills | Status: DC | PRN

## 2017-03-27 MED ORDER — IPRATROPIUM-ALBUTEROL 0.5-2.5 (3) MG/3ML IN SOLN
3.0000 mL | Freq: Four times a day (QID) | RESPIRATORY_TRACT | Status: DC
  Administered 2017-03-27: 3 mL via RESPIRATORY_TRACT

## 2017-03-27 MED ORDER — IPRATROPIUM-ALBUTEROL 0.5-2.5 (3) MG/3ML IN SOLN: 3.0000 mL | Freq: Four times a day (QID) | RESPIRATORY_TRACT | 1 refills | Status: DC | PRN

## 2017-03-27 NOTE — Progress Notes (Signed)
Subjective:    Patient ID: Angela Vang, female    DOB: 05-15-1986, 31 y.o.   MRN: [id number]  HPI  31 year old female who  has a past medical history of ADHD (attention deficit hyperactivity disorder); Anxiety; Calculus of kidney; Depression; Herpes; History of chickenpox; anorexia nervosa; gonorrhea; varicella; Multiple pregnancy loss, not currently pregnant (08/05/2011); Palpitations (03/27/2013); and Telogen hair loss. she presents to the office today for follow up after being treated at Urgent care for a cough. She was treated with a Z-pack, prednisone, and tessalon pearls.   She also had a chest x ray and and EKG which she reports is " normal"   She reports that the tessalon pearls have helped the most. The antibiotics and prednisone did not help at all. She did received a nebulizer treatment at the Urgent Care that helped with her cough.   She also complains of " congestion in my upper chest, and shortness of breath"   Her symptoms started two weeks ago after her work was spraying " bug spray". She denies any improvement when outside of work   Review of Systems  Constitutional: Negative.   HENT: Positive for congestion. Negative for ear discharge, ear pain, postnasal drip, rhinorrhea, sinus pain, sinus pressure, sore throat and trouble swallowing.   Eyes: Negative.   Respiratory: Positive for cough, chest tightness and shortness of breath. Negative for wheezing.   Cardiovascular: Negative.   Neurological: Negative.   All other systems reviewed and are negative.  Past Medical History:  Diagnosis Date  . ADHD (attention deficit hyperactivity disorder)   . Anxiety   . Calculus of kidney   . Depression   . Herpes    cold sores no vag lesions  . History of chickenpox   . Hx of anorexia nervosa   . Hx of gonorrhea    age 22  . Hx of varicella   . Multiple pregnancy loss, not currently pregnant 08/05/2011   x 2 first trimester  sees dr Pennie Rushing   . Palpitations 03/27/2013    . Telogen hair loss     Social History   Social History  . Marital status: Married    Spouse name: N/A  . Number of children: 1  . Years of education: N/A   Occupational History  . [employer]    Social History Main Topics  . Smoking status: Former Smoker    Types: Cigarettes    Quit date: 09/13/2012  . Smokeless tobacco: Never Used     Comment: occasional when drinking  . Alcohol use No  . Drug use: No  . Sexual activity: Yes   Other Topics Concern  . 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    31 yo goes to daycare   HH   3 husband and infant 2 dogs and a  Cat  hh of           Past Surgical History:  Procedure Laterality Date  . CESAREAN SECTION N/A 05/22/2013   Procedure: Primary Cesarean Section Delivery Baby Girl @ 2137, Apgars 8/9;  Surgeon: Zelphia Cairo, MD;  Location: [facility];  Service: Obstetrics;  Laterality: N/A;  . DILATION AND CURETTAGE OF UTERUS    .  KIDNEY STONE SURGERY     with stent  . WISDOM TOOTH EXTRACTION      Family History  Problem Relation Age of Onset  . Diabetes Mother   . Hypertension Mother   . Depression Father   . Bipolar disorder Father   . Leukemia Maternal Grandmother   . Mental illness Brother        suicide att  . Mental illness Paternal Grandfather        suicide att  . Breast cancer Maternal Aunt   . Irritable bowel syndrome Mother     Allergies  Allergen Reactions  . Amoxicillin Itching    Current Outpatient Prescriptions on File Prior to Visit  Medication Sig Dispense Refill  . cetirizine (ZYRTEC) 10 MG tablet Take 10 mg by mouth daily.    . fluticasone (FLONASE) 50 MCG/ACT nasal spray Place 1 spray into both nostrils as needed for allergies or rhinitis.    Marland Kitchen LORazepam (ATIVAN) 0.5 MG tablet Take 1 tablet (0.5 mg  total) by mouth 2 (two) times daily. 60 tablet 0  . Multiple Vitamins-Minerals (MULTIVITAMIN PO) Take by mouth.    . meclizine (ANTIVERT) 25 MG tablet Take 1 tablet (25 mg total) by mouth 3 (three) times daily as needed for dizziness. (Patient not taking: Reported on 01/09/2017) 30 tablet 0   No current facility-administered medications on file prior to visit.     BP 120/74 (BP Location: Left Arm)   Pulse (!) 112   Temp 98.2 F (36.8 C) (Oral)   Wt 220 lb (99.8 kg)   SpO2 99%   BMI 36.05 kg/m       Objective:   Physical Exam  Constitutional: She is oriented to person, place, and time. She appears well-developed and well-nourished. No distress.  HENT:  Head: Normocephalic.  Nose: Nose normal.  Mouth/Throat: Oropharynx is clear and moist. No oropharyngeal exudate.  Neck: Neck supple.  Cardiovascular: Normal rate, regular rhythm, normal heart sounds and intact distal pulses.  Exam reveals no gallop and no friction rub.   No murmur heard. Pulmonary/Chest: Effort normal and breath sounds normal. She has no wheezes. She has no rales. She exhibits no tenderness.  Lymphadenopathy:    She has no cervical adenopathy.  Neurological: She is alert and oriented to person, place, and time.  Skin: She is not diaphoretic.  Nursing note and vitals reviewed.     Assessment & Plan:  1. Upper airway cough syndrome - Appears to be viral in nature  - ipratropium-albuterol (DUONEB) 0.5-2.5 (3) MG/3ML nebulizer solution 3 mL; Take 3 mLs by nebulization every 6 (six) hours. - benzonatate (TESSALON) 200 MG capsule; Take 1 capsule (200 mg total) by mouth 3 (three) times daily as needed for cough.  Dispense: 20 capsule; Refill: 2 - ipratropium-albuterol (DUONEB) 0.5-2.5 (3) MG/3ML SOLN; Take 3 mLs by nebulization every 6 (six) hours as needed.  Dispense: 360 mL; Refill: 1 - HYDROcodone-homatropine (HYCODAN) 5-1.5 MG/5ML syrup; Take 5 mLs by mouth every 8 (eight) hours as needed for cough.  Dispense: 120  mL; Refill: 0 - Will provide home nebulizer.  - She felt improvement in cough, chest congestion and shortness of breath after nebulizer was given   Shirline Frees, NP

## 2017-07-11 ENCOUNTER — Ambulatory Visit: Payer: BLUE CROSS/BLUE SHIELD | Admitting: Adult Health

## 2017-07-11 VITALS — BP 110/80 | HR 104 | Temp 98.8°F | Ht 65.5 in | Wt 223.0 lb

## 2017-07-11 DIAGNOSIS — J029 Acute pharyngitis, unspecified: Secondary | ICD-10-CM | POA: Diagnosis not present

## 2017-07-11 LAB — POCT RAPID STREP A (OFFICE): Rapid Strep A Screen: NEGATIVE

## 2017-07-11 MED ORDER — CEPHALEXIN 500 MG PO CAPS: 500.0000 mg | ORAL_CAPSULE | Freq: Two times a day (BID) | ORAL | 0 refills | Status: AC

## 2017-07-11 MED ORDER — MAGIC MOUTHWASH W/LIDOCAINE: 5.0000 mL | Freq: Three times a day (TID) | ORAL | 0 refills | Status: DC | PRN

## 2017-07-11 NOTE — Progress Notes (Signed)
Subjective:    Patient ID: Angela Vang, female    DOB: 01-05-1986, 32 y.o.   MRN: [id number]  Sore Throat   This is a new problem. The current episode started in the past 7 days. The problem has been gradually worsening. Neither side of throat is experiencing more pain than the other. The maximum temperature recorded prior to her arrival was 101 - 101.9 F. The fever has been present for less than 1 day. The pain is moderate. Associated symptoms include swollen glands and trouble swallowing. Pertinent negatives include no coughing, ear discharge, ear pain, headaches, hoarse voice, neck pain or stridor. She has had no exposure to strep or mono. She has tried nothing for the symptoms. The treatment provided no relief.   Review of Systems  Constitutional: Positive for appetite change and fever.  HENT: Positive for postnasal drip, trouble swallowing and voice change. Negative for ear discharge, ear pain, hoarse voice, rhinorrhea, sinus pressure and sinus pain.   Respiratory: Negative.  Negative for cough and stridor.   Musculoskeletal: Negative for neck pain.  Neurological: Negative for headaches.   Past Medical History:  Diagnosis Date  . ADHD (attention deficit hyperactivity disorder)   . Anxiety   . Calculus of kidney   . Depression   . Herpes    cold sores no vag lesions  . History of chickenpox   . Hx of anorexia nervosa   . Hx of gonorrhea    age 32  . Hx of varicella   . Multiple pregnancy loss, not currently pregnant 08/05/2011   x 2 first trimester  sees dr Pennie RushingHaygood   . Palpitations 03/27/2013  . Telogen hair loss     Social History   Socioeconomic History  . Marital status: Married    Spouse name: Not on file  . Number of children: 1  . Years of education: Not on file  . Highest education level: Not on file  Social Needs  . Financial resource strain: Not on file  . Food insecurity - worry: Not on file  . Food insecurity - inability: Not on file  . Transportation  needs - medical: Not on file  . Transportation needs - non-medical: Not on file  Occupational History  . Occupation: [employer]  Tobacco Use  . Smoking status: Former Smoker    Types: Cigarettes    Last attempt to quit: 09/13/2012    Years since quitting: 4.8  . Smokeless tobacco: Never Used  . Tobacco comment: occasional when drinking  Substance and Sexual Activity  . Alcohol use: No    Alcohol/week: 0.0 oz  . Drug use: No  . Sexual activity: Yes  Other Topics Concern  . 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    32 yo goes to daycare   HH   3 husband and infant 2 dogs and a  Cat  hh of        Past Surgical History:  Procedure Laterality Date  . CESAREAN SECTION N/A 05/22/2013   Procedure: Primary Cesarean Section Delivery Baby Girl @ 2137, Apgars 8/9;  Surgeon: Zelphia CairoGretchen Adkins, MD;  Location: [facility];  Service: Obstetrics;  Laterality: N/A;  . DILATION AND CURETTAGE OF  UTERUS    . KIDNEY STONE SURGERY     with stent  . WISDOM TOOTH EXTRACTION      Family History  Problem Relation Age of Onset  . Diabetes Mother   . Hypertension Mother   . Depression Father   . Bipolar disorder Father   . Leukemia Maternal Grandmother   . Mental illness Brother        suicide att  . Mental illness Paternal Grandfather        suicide att  . Breast cancer Maternal Aunt   . Irritable bowel syndrome Mother     Allergies  Allergen Reactions  . Amoxicillin Itching    Current Outpatient Medications on File Prior to Visit  Medication Sig Dispense Refill  . benzonatate (TESSALON) 200 MG capsule Take 1 capsule (200 mg total) by mouth 3 (three) times daily as needed for cough. 20 capsule 2  . cetirizine (ZYRTEC) 10 MG tablet Take 10 mg by mouth daily.    . fluticasone (FLONASE) 50 MCG/ACT nasal  spray Place 1 spray into both nostrils as needed for allergies or rhinitis.    Marland Kitchen HYDROcodone-homatropine (HYCODAN) 5-1.5 MG/5ML syrup Take 5 mLs by mouth every 8 (eight) hours as needed for cough. 120 mL 0  . ipratropium-albuterol (DUONEB) 0.5-2.5 (3) MG/3ML SOLN Take 3 mLs by nebulization every 6 (six) hours as needed. 360 mL 1  . LORazepam (ATIVAN) 0.5 MG tablet Take 1 tablet (0.5 mg total) by mouth 2 (two) times daily. 60 tablet 0  . meclizine (ANTIVERT) 25 MG tablet Take 1 tablet (25 mg total) by mouth 3 (three) times daily as needed for dizziness. 30 tablet 0  . Multiple Vitamins-Minerals (MULTIVITAMIN PO) Take by mouth.     Current Facility-Administered Medications on File Prior to Visit  Medication Dose Route Frequency Provider Last Rate Last Dose  . ipratropium-albuterol (DUONEB) 0.5-2.5 (3) MG/3ML nebulizer solution 3 mL  3 mL Nebulization Q6H Filippo Puls, NP   3 mL at 03/27/17 1018    BP 110/80 (BP Location: Left Arm, Patient Position: Sitting, Cuff Size: Large)   Pulse (!) 104   Temp 98.8 F (37.1 C) (Oral)   Ht 5' 5.5" (1.664 m)   Wt 223 lb (101.2 kg)   SpO2 98%   BMI 36.54 kg/m       Objective:   Physical Exam  Constitutional: She is oriented to person, place, and time. She appears well-developed and well-nourished. No distress.  HENT:  Right Ear: Hearing, tympanic membrane, external ear and ear canal normal.  Left Ear: Hearing, tympanic membrane, external ear and ear canal normal.  Nose: Nose normal. No mucosal edema or rhinorrhea.  Mouth/Throat: Uvula is midline and mucous membranes are normal. Posterior oropharyngeal edema and posterior oropharyngeal erythema present. No oropharyngeal exudate or tonsillar abscesses.  Cardiovascular: Normal rate, regular rhythm, normal heart sounds and intact distal pulses. Exam reveals no gallop and no friction rub.  No murmur heard. Pulmonary/Chest: Effort normal and breath sounds normal. No respiratory distress. She has no  wheezes. She has no rales. She exhibits no tenderness.  Lymphadenopathy:       Head (right side): Submandibular and tonsillar adenopathy present.       Head (left side): Submandibular and tonsillar adenopathy present.  Neurological: She is alert and oriented to person, place, and time.  Skin: Skin is warm and dry. No rash noted. She is not diaphoretic. No erythema. No pallor.  Psychiatric: She has a normal mood and affect. Her behavior  is normal. Judgment and thought content normal.  Nursing note and vitals reviewed.     Assessment & Plan:  1. Sore throat -Rapid strep is negative, but will treat due to signs and symptoms  - Centor Score 4  - POC Rapid Strep A- negative  - cephALEXin (KEFLEX) 500 MG capsule; Take 1 capsule (500 mg total) by mouth 2 (two) times daily for 10 days.  Dispense: 20 capsule; Refill: 0 - magic mouthwash w/lidocaine SOLN; Take 5 mLs by mouth 3 (three) times daily as needed.  Dispense: 180 mL; Refill: 0 - Follow up if no improvement   Shirline Frees, NP

## 2017-07-12 ENCOUNTER — Ambulatory Visit: Payer: Self-pay

## 2017-07-12 NOTE — Telephone Encounter (Signed)
Pt. Called and reports she "feel worse than she did yesterday." Is running a fever today. Wants to know if she stop antibiotic. Encouraged her not to stop medication. Instructed it could take 24-48 hours for antibiotic to work. Will call back tomorrow if still runny a fever. Instructed to treat fever with Tylenol or Motrin and to stay hydrated. Verbalizes understanding.

## 2017-07-13 ENCOUNTER — Ambulatory Visit: Payer: Self-pay | Admitting: *Deleted

## 2017-07-13 NOTE — Telephone Encounter (Signed)
Patient called again to advise that she is not feeling any better. She has temp of 100.8. She would like to know what you recommend.  Cory - Please advise. Thanks!

## 2017-07-13 NOTE — Telephone Encounter (Signed)
Spoke with pt and advised. Also recommended she take OTC cough and decongestant medications such as Delsym Robitussin and Mucinex. Also drink plenty of water to help loosen mucus. She voiced understanding to all. She does need work note extended until Monday. She will have it faxed to us.  Cory - FYI. Thanks!

## 2017-07-13 NOTE — Telephone Encounter (Signed)
I would advise to stay on this antibiotic. Fever should diminish after about 48 hours of being on abx

## 2017-07-13 NOTE — Telephone Encounter (Signed)
Spoke with Angela Vang at [facility] regarding pt symptoms; she will route this to provider; Called pt to make her aware that the provider will be notified and she will get a call back from the office today; pt verbalizes understanding.

## 2017-08-07 ENCOUNTER — Ambulatory Visit: Payer: BLUE CROSS/BLUE SHIELD | Admitting: Adult Health

## 2017-08-14 ENCOUNTER — Encounter: Payer: Self-pay | Admitting: Adult Health

## 2017-08-14 ENCOUNTER — Ambulatory Visit: Payer: BLUE CROSS/BLUE SHIELD | Admitting: Adult Health

## 2017-08-14 DIAGNOSIS — F902 Attention-deficit hyperactivity disorder, combined type: Secondary | ICD-10-CM | POA: Diagnosis not present

## 2017-08-14 DIAGNOSIS — H8102 Meniere's disease, left ear: Secondary | ICD-10-CM | POA: Diagnosis not present

## 2017-08-14 MED ORDER — LORAZEPAM 0.5 MG PO TABS: 0.5000 mg | ORAL_TABLET | Freq: Two times a day (BID) | ORAL | 0 refills | Status: DC

## 2017-08-14 NOTE — Patient Instructions (Signed)
Please follow up with [facility]. They are all good over there but Amy Elisabeth MostStevenson is the doctor that I send most people to   Address: [address], [place], [place] [address] Phone: [phone]

## 2017-08-14 NOTE — Progress Notes (Signed)
Subjective:    Patient ID: Angela Vang, female    DOB: 27-Apr-1986, 32 y.o.   MRN: [id number]  HPI  32 year old female who  has a past medical history of ADHD (attention deficit hyperactivity disorder), Anxiety, Calculus of kidney, Depression, Herpes, History of chickenpox, anorexia nervosa, gonorrhea, varicella, Multiple pregnancy loss, not currently pregnant (08/05/2011), Palpitations (03/27/2013), and Telogen hair loss. She presents to the office today for refill of Ativan for Meniere's disease, she takes this medication as needed when she is experiencing dizziness and lightheadedness.   She reports that she has been out of this medication and for the last 2-3 days has been experiencing lightheadedness, dizziness and not wanting to get out of bed.  She has her initial appointment with ENT next week.   She would also like to discuss going back on Addrall for ADHD. It has been greater than a years since she was on the this medication and reports that she did not like taking it due to side effects of palpitations and insomnia.    Review of Systems See HPI   Past Medical History:  Diagnosis Date  . ADHD (attention deficit hyperactivity disorder)   . Anxiety   . Calculus of kidney   . Depression   . Herpes    cold sores no vag lesions  . History of chickenpox   . Hx of anorexia nervosa   . Hx of gonorrhea    age 32  . Hx of varicella   . Multiple pregnancy loss, not currently pregnant 08/05/2011   x 2 first trimester  sees dr Pennie RushingHaygood   . Palpitations 03/27/2013  . Telogen hair loss     Social History   Socioeconomic History  . Marital status: Married    Spouse name: Not on file  . Number of children: 1  . Years of education: Not on file  . Highest education level: Not on file  Social Needs  . Financial resource strain: Not on file  . Food insecurity - worry: Not on file  . Food insecurity - inability: Not on file  . Transportation needs - medical: Not on file  .  Transportation needs - non-medical: Not on file  Occupational History  . Occupation: [employer]  Tobacco Use  . Smoking status: Former Smoker    Types: Cigarettes    Last attempt to quit: 09/13/2012    Years since quitting: 4.9  . Smokeless tobacco: Never Used  . Tobacco comment: occasional when drinking  Substance and Sexual Activity  . Alcohol use: No    Alcohol/week: 0.0 oz  . Drug use: No  . Sexual activity: Yes  Other Topics Concern  . 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    32 yo goes to daycare   HH   3 husband and infant 2 dogs and a  Cat  hh of        Past Surgical History:  Procedure Laterality Date  . CESAREAN SECTION N/A 05/22/2013   Procedure: Primary Cesarean Section Delivery Baby Girl @ 2137, Apgars 8/9;  Surgeon: Zelphia CairoGretchen Adkins, MD;  Location: [facility];  Service: Obstetrics;  Laterality: N/A;  . DILATION AND CURETTAGE OF UTERUS    .  KIDNEY STONE SURGERY     with stent  . WISDOM TOOTH EXTRACTION      Family History  Problem Relation Age of Onset  . Diabetes Mother   . Hypertension Mother   . Depression Father   . Bipolar disorder Father   . Leukemia Maternal Grandmother   . Mental illness Brother        suicide att  . Mental illness Paternal Grandfather        suicide att  . Breast cancer Maternal Aunt   . Irritable bowel syndrome Mother     Allergies  Allergen Reactions  . Amoxicillin Itching    Current Outpatient Medications on File Prior to Visit  Medication Sig Dispense Refill  . benzonatate (TESSALON) 200 MG capsule Take 1 capsule (200 mg total) by mouth 3 (three) times daily as needed for cough. 20 capsule 2  . cetirizine (ZYRTEC) 10 MG tablet Take 10 mg by mouth daily.    . fluticasone (FLONASE) 50 MCG/ACT nasal spray Place 1 spray into both  nostrils as needed for allergies or rhinitis.    Marland Kitchen HYDROcodone-homatropine (HYCODAN) 5-1.5 MG/5ML syrup Take 5 mLs by mouth every 8 (eight) hours as needed for cough. 120 mL 0  . ipratropium-albuterol (DUONEB) 0.5-2.5 (3) MG/3ML SOLN Take 3 mLs by nebulization every 6 (six) hours as needed. 360 mL 1  . magic mouthwash w/lidocaine SOLN Take 5 mLs by mouth 3 (three) times daily as needed. 180 mL 0  . meclizine (ANTIVERT) 25 MG tablet Take 1 tablet (25 mg total) by mouth 3 (three) times daily as needed for dizziness. 30 tablet 0  . Multiple Vitamins-Minerals (MULTIVITAMIN PO) Take by mouth.     Current Facility-Administered Medications on File Prior to Visit  Medication Dose Route Frequency Provider Last Rate Last Dose  . ipratropium-albuterol (DUONEB) 0.5-2.5 (3) MG/3ML nebulizer solution 3 mL  3 mL Nebulization Q6H Chavonne Sforza, NP   3 mL at 03/27/17 1018    There were no vitals taken for this visit.      Objective:   Physical Exam  Constitutional: She is oriented to person, place, and time. She appears well-developed and well-nourished. No distress.  Cardiovascular: Normal rate, regular rhythm, normal heart sounds and intact distal pulses. Exam reveals no gallop and no friction rub.  No murmur heard. Pulmonary/Chest: Effort normal and breath sounds normal. No respiratory distress. She has no wheezes. She has no rales. She exhibits no tenderness.  Neurological: She is alert and oriented to person, place, and time.  Skin: Skin is warm and dry. No rash noted. No erythema. No pallor.  Psychiatric: She has a normal mood and affect. Her behavior is normal. Judgment and thought content normal.  Nursing note and vitals reviewed.     Assessment & Plan:  1. Meniere's disease of left ear - LORazepam (ATIVAN) 0.5 MG tablet; Take 1 tablet (0.5 mg total) by mouth 2 (two) times daily.  Dispense: 60 tablet; Refill: 0 - Will wait on ENTs recommendation   2. Attention deficit hyperactivity disorder  (ADHD), combined type - phone number given for referral to [facility]   Shirline Frees, NP

## 2017-08-16 ENCOUNTER — Telehealth: Payer: Self-pay | Admitting: Adult Health

## 2017-08-16 ENCOUNTER — Encounter: Payer: Self-pay | Admitting: Family Medicine

## 2017-08-16 NOTE — Telephone Encounter (Deleted)
Copied from CRM [phone]. Topic: Quick Communication - See Telephone Encounter >> Aug 16, 2017 11:59 AM Trula SladeWalter, Linda F wrote: CRM for notification. See Telephone encounter for:  08/16/17.

## 2017-08-16 NOTE — Telephone Encounter (Signed)
ERROR

## 2017-08-16 NOTE — Telephone Encounter (Signed)
[employer] sick verification form to be filled out.  Call [phone] upon completion.

## 2017-08-21 NOTE — Telephone Encounter (Signed)
Left a message for a return call.  Need the first day of work missed and the day she returned to work.  CRM created.

## 2017-08-22 NOTE — Telephone Encounter (Signed)
Left a message for a return call.

## 2017-08-23 ENCOUNTER — Telehealth: Payer: Self-pay | Admitting: Adult Health

## 2017-08-23 NOTE — Telephone Encounter (Signed)
Pt called to let Misty know she will no longer need the paperwork. It can be thrown away. The note the doctor wrote was all her employment needed.

## 2017-08-23 NOTE — Telephone Encounter (Signed)
Noted  

## 2017-08-23 NOTE — Telephone Encounter (Signed)
Copied from CRM [phone]. Topic: Quick Communication - Office Called Patient >> Aug 21, 2017  6:01 PM Raj JanusAdkins, Misty T, [place] wrote: Reason for CRM:   Have form to fill out for pt.  Need to know the first day of missed work and the day she returned.  Please forward this information to me.  I will call pt when paper work is ready for pick up.

## 2017-08-23 NOTE — Telephone Encounter (Signed)
Spoke to the pt.  She is unsure is her employer needs the paper work.  She will check and call back.  Will hang on to paper work until I hear from her.

## 2017-08-24 NOTE — Telephone Encounter (Signed)
Per pt, paper work is not needed.  Sent to recycle.  No further action required.

## 2017-09-14 ENCOUNTER — Telehealth: Payer: Self-pay | Admitting: Family Medicine

## 2017-09-14 NOTE — Telephone Encounter (Signed)
Received a consult note from [facility].  Dr. Serena ColonelJefry Rosen thinks the pt is having migraines and recommended a referral to neurology.  Tried to reach the pt to see if she would like a referral.  Left a message for a call back.

## 2017-09-18 NOTE — Telephone Encounter (Signed)
Left a message for a return call.

## 2017-09-20 NOTE — Telephone Encounter (Signed)
Left a message for a return call.

## 2017-10-02 ENCOUNTER — Encounter: Payer: Self-pay | Admitting: Family Medicine

## 2017-10-02 ENCOUNTER — Ambulatory Visit (INDEPENDENT_AMBULATORY_CARE_PROVIDER_SITE_OTHER)
Admission: RE | Admit: 2017-10-02 | Discharge: 2017-10-02 | Disposition: A | Discharge status: Home / Self Care | Payer: BLUE CROSS/BLUE SHIELD | Source: Ambulatory Visit | Attending: Family Medicine | Admitting: Family Medicine

## 2017-10-02 ENCOUNTER — Encounter: Payer: Self-pay | Admitting: *Deleted

## 2017-10-02 ENCOUNTER — Ambulatory Visit: Payer: BLUE CROSS/BLUE SHIELD | Admitting: Family Medicine

## 2017-10-02 VITALS — BP 120/86 | HR 106 | Temp 98.7°F | Resp 16 | Ht 65.5 in | Wt 218.1 lb

## 2017-10-02 DIAGNOSIS — J4520 Mild intermittent asthma, uncomplicated: Secondary | ICD-10-CM

## 2017-10-02 DIAGNOSIS — R05 Cough: Secondary | ICD-10-CM | POA: Diagnosis not present

## 2017-10-02 DIAGNOSIS — R053 Chronic cough: Secondary | ICD-10-CM

## 2017-10-02 MED ORDER — AZITHROMYCIN 250 MG PO TABS: ORAL_TABLET | ORAL | 0 refills | Status: AC

## 2017-10-02 MED ORDER — PREDNISONE 20 MG PO TABS: 40.0000 mg | ORAL_TABLET | Freq: Every day | ORAL | 0 refills | Status: AC

## 2017-10-02 NOTE — Progress Notes (Signed)
ACUTE VISIT  HPI:  Chief Complaint  Patient presents with  . Cough    for 3 weeks    Angela Vang is a 32 y.o.female here today complaining of 3 weeks days of respiratory symptoms.  Productive cough with white sputum and associated dyspnea with "any activity", like walking.  No associated chest pain, palpitations, diaphoresis, or syncope. Alleviated by rest.  She has history of asthma but has not noted wheezing. She used albuterol inhaler a couple times. No history of GERD or heartburn.  Cough  This is a new problem. The current episode started 1 to 4 weeks ago. The problem has been unchanged. The problem occurs every few minutes. The cough is productive of sputum. Pertinent negatives include no chest pain, ear congestion, fever, headaches, heartburn, hemoptysis, nasal congestion, postnasal drip, rash, rhinorrhea, sore throat, shortness of breath, weight loss or wheezing. The symptoms are aggravated by exercise. She has tried a beta-agonist inhaler for the symptoms. The treatment provided mild relief. Her past medical history is significant for asthma and environmental allergies.   She has been taking Hycodan, which was prescribed a few months ago (04/2017).  No Hx of recent travel. No sick contact. Asthma and allergic rhinitis.  Currently she is on  Hx of allergies: Flonase nasal spray and Zyrtec 10 mg daily.   Review of Systems  Constitutional: Positive for fatigue. Negative for activity change, appetite change, fever and weight loss.  HENT: Negative for congestion, mouth sores, postnasal drip, rhinorrhea, sinus pressure, sore throat, trouble swallowing and voice change.   Respiratory: Positive for cough. Negative for hemoptysis, shortness of breath and wheezing.   Cardiovascular: Negative for chest pain and palpitations.  Gastrointestinal: Negative for abdominal pain, diarrhea, heartburn, nausea and vomiting.  Musculoskeletal: Negative for back pain and neck  pain.  Skin: Negative for rash.  Allergic/Immunologic: Positive for environmental allergies.  Neurological: Negative for weakness and headaches.  Hematological: Negative for adenopathy. Does not bruise/bleed easily.  Psychiatric/Behavioral: Positive for sleep disturbance. Negative for confusion.      Current Outpatient Medications on File Prior to Visit  Medication Sig Dispense Refill  . cetirizine (ZYRTEC) 10 MG tablet Take 10 mg by mouth daily.    . fluticasone (FLONASE) 50 MCG/ACT nasal spray Place 1 spray into both nostrils as needed for allergies or rhinitis.    Marland Kitchen HYDROcodone-homatropine (HYCODAN) 5-1.5 MG/5ML syrup Take 5 mLs by mouth every 8 (eight) hours as needed for cough. 120 mL 0  . ipratropium-albuterol (DUONEB) 0.5-2.5 (3) MG/3ML SOLN Take 3 mLs by nebulization every 6 (six) hours as needed. 360 mL 1  . Multiple Vitamins-Minerals (MULTIVITAMIN PO) Take by mouth.    Marland Kitchen VYVANSE 20 MG capsule      Current Facility-Administered Medications on File Prior to Visit  Medication Dose Route Frequency Provider Last Rate Last Dose  . ipratropium-albuterol (DUONEB) 0.5-2.5 (3) MG/3ML nebulizer solution 3 mL  3 mL Nebulization Q6H Nafziger, Cory, NP   3 mL at 03/27/17 1018     Past Medical History:  Diagnosis Date  . ADHD (attention deficit hyperactivity disorder)   . Anxiety   . Calculus of kidney   . Depression   . Herpes    cold sores no vag lesions  . History of chickenpox   . Hx of anorexia nervosa   . Hx of gonorrhea    age 44  . Hx of varicella   . Multiple pregnancy loss, not currently pregnant 08/05/2011  x 2 first trimester  sees dr Pennie RushingHaygood   . Palpitations 03/27/2013  . Telogen hair loss    Allergies  Allergen Reactions  . Amoxicillin Itching    Social History   Socioeconomic History  . Marital status: Married    Spouse name: Not on file  . Number of children: 1  . Years of education: Not on file  . Highest education level: Not on file  Occupational  History  . Occupation: Programme researcher, [employer]  Social Needs  . Financial resource strain: Not on file  . Food insecurity:    Worry: Not on file    Inability: Not on file  . Transportation needs:    Medical: Not on file    Non-medical: Not on file  Tobacco Use  . Smoking status: Former Smoker    Types: Cigarettes    Last attempt to quit: 09/13/2012    Years since quitting: 5.0  . Smokeless tobacco: Never Used  . Tobacco comment: occasional when drinking  Substance and Sexual Activity  . Alcohol use: No    Alcohol/week: 0.0 oz  . Drug use: No  . Sexual activity: Yes  Lifestyle  . Physical activity:    Days per week: Not on file    Minutes per session: Not on file  . Stress: Not on file  Relationships  . Social connections:    Talks on phone: Not on file    Gets together: Not on file    Attends religious service: Not on file    Active member of club or organization: Not on file    Attends meetings of clubs or organizations: Not on file    Relationship status: Not on file  Other Topics Concern  . 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    32 yo goes to daycare   HH   3 husband and infant 2 dogs and a  Cat  hh of        Vitals:   10/02/17 1112  BP: 120/86  Pulse: (!) 106  Resp: 16  Temp: 98.7 F (37.1 C)  SpO2: 98%   Body mass index is 35.75 kg/m.   Physical Exam  Nursing note and vitals reviewed. Constitutional: She is oriented to person, place, and time. She appears well-developed. She does not appear ill. No distress.  HENT:  Head: Normocephalic and atraumatic.  Nose: No rhinorrhea.  Mouth/Throat: Oropharynx is clear and moist and mucous membranes are normal.  Eyes: Pupils are equal, round, and reactive to light. Conjunctivae are normal.  Cardiovascular: Regular rhythm.  Tachycardia present.  No murmur heard. Respiratory: Effort normal and breath sounds normal. No respiratory distress. She has no wheezes. She has no rales.  Prolonged expiration. Nonproductive cough persisted during visit.  Musculoskeletal: She exhibits no edema.  Lymphadenopathy:       Head (right side): No submandibular adenopathy present.       Head (left side): No submandibular adenopathy present.    She has no cervical adenopathy.  Neurological: She is alert and oriented to person, place, and time. She has normal strength. Gait normal.  Skin: Skin is warm. No rash noted. No erythema.  Psychiatric: She has a normal mood and affect. Her  speech is normal.  Well groomed, good eye contact.      ASSESSMENT AND PLAN:   Ms. Reilyn was seen today for cough.  Diagnoses and all orders for this visit:  Persistent cough for 3 weeks or longer  We discussed some possible etiologies, including GI, allergies, residual symptoms after acute URI among some. Because of persistent cough antibiotic treatment were recommended, explained that if he is not an infectious process he will not help.  Further recommendations will be given according to imaging results.  -     azithromycin (ZITHROMAX) 250 MG tablet; 2 tabs day one then 1 tab daily for 4 days. -     DG Chest 2 View; Future -     predniSONE (DELTASONE) 20 MG tablet; Take 2 tablets (40 mg total) by mouth daily with breakfast for 3 days.  Intermittent asthma without complication, unspecified asthma severity  Auscultation today negative for rales or wheezing, mild prolonged expiration. Albuterol inh 2 puff every 6 hours for a week then as needed for wheezing or shortness of breath.  Short course of prednisone.   Follow with PCP if not better in 1 to 2 weeks, before if symptoms get worse.      Corie Vavra G. Swaziland, MD  [facility]. [facility].

## 2017-10-02 NOTE — Patient Instructions (Addendum)
  [name] I have seen you today for an acute visit.  A few things to remember from today's visit:   Intermittent asthma without complication, unspecified asthma severity  Persistent cough for 3 weeks or longer - Plan: DG Chest 2 View   Medications prescribed today are intended for short period of time and will not be refill upon request, a follow up appointment might be necessary to discuss continuation of of treatment if appropriate.   Albuterol inh 2 puff every 6 hours for a week then as needed for wheezing or shortness of breath.  Today X ray was ordered.  This [facility] be done at [facility] [address] between 8 am and 5 pm: [address] [phone].   In general please monitor for signs of worsening symptoms and seek immediate medical attention if any concerning.  If symptoms are not resolved in 1-2 weeks you should schedule a follow up appointment with your doctor, before if needed.  I hope you get better soon!

## 2017-10-03 ENCOUNTER — Encounter: Payer: Self-pay | Admitting: Family Medicine

## 2017-10-09 ENCOUNTER — Telehealth: Payer: Self-pay | Admitting: Family Medicine

## 2017-10-09 NOTE — Telephone Encounter (Signed)
Copied from CRM [id number]. Topic: General - Other >> Oct 09, 2017 10:01 AM Leafy Ro wrote: Reason for CRM:pt saw dr Swaziland on 10-02-17 and needs a new work note from 10-02-17 thru 4-26 and pt returned to work on 4-27. Pt would like to pick up note on 10-11-17

## 2017-10-10 ENCOUNTER — Encounter: Payer: Self-pay | Admitting: *Deleted

## 2017-10-10 NOTE — Telephone Encounter (Signed)
Patient informed that extended work note was approved by Dr. Swaziland and that she stated that she would not be doing any FMLA paperwork. Patient verbalized understanding.

## 2017-10-15 ENCOUNTER — Telehealth: Payer: Self-pay | Admitting: Adult Health

## 2017-10-15 NOTE — Telephone Encounter (Signed)
Copied from CRM [id number]. Topic: Quick Communication - See Telephone Encounter >> Oct 15, 2017 10:51 AM Diana Eves B wrote: CRM for notification. See Telephone encounter for: 10/15/17.  Pt was seen in the office on 10/02/17 with Dr. Swaziland and was told to use her inhaler. The pt has a powder inhaler and states it only makes her cough more. She had an appt on 10/18/17 scheduled to see Kandee Keen to discuss a different inhaler but is unable to keep it due to work schedule. She is hoping a non powder inhaler can be called in to [id number] - [place], [place] - [address]

## 2017-10-16 NOTE — Telephone Encounter (Signed)
Spoke to the pt.  She informed me that she is taking ProAir Respiclick inhaler,  Powder bother her.  Advised the pt to call the insurance company and find out what is covered.  She will call back.

## 2017-10-18 ENCOUNTER — Ambulatory Visit: Payer: BLUE CROSS/BLUE SHIELD | Admitting: Adult Health

## 2017-11-10 ENCOUNTER — Emergency Department (HOSPITAL_BASED_OUTPATIENT_CLINIC_OR_DEPARTMENT_OTHER)
Admission: EM | Admit: 2017-11-10 | Discharge: 2017-11-10 | Disposition: A | Discharge status: Home / Self Care | Payer: BLUE CROSS/BLUE SHIELD | Attending: Emergency Medicine | Admitting: Emergency Medicine

## 2017-11-10 ENCOUNTER — Emergency Department (HOSPITAL_BASED_OUTPATIENT_CLINIC_OR_DEPARTMENT_OTHER): Payer: BLUE CROSS/BLUE SHIELD

## 2017-11-10 ENCOUNTER — Encounter (HOSPITAL_BASED_OUTPATIENT_CLINIC_OR_DEPARTMENT_OTHER): Payer: Self-pay | Admitting: Emergency Medicine

## 2017-11-10 ENCOUNTER — Other Ambulatory Visit: Payer: Self-pay

## 2017-11-10 DIAGNOSIS — R079 Chest pain, unspecified: Secondary | ICD-10-CM | POA: Insufficient documentation

## 2017-11-10 DIAGNOSIS — R002 Palpitations: Secondary | ICD-10-CM | POA: Diagnosis present

## 2017-11-10 DIAGNOSIS — Z87891 Personal history of nicotine dependence: Secondary | ICD-10-CM | POA: Insufficient documentation

## 2017-11-10 DIAGNOSIS — Z79899 Other long term (current) drug therapy: Secondary | ICD-10-CM | POA: Diagnosis not present

## 2017-11-10 DIAGNOSIS — J45909 Unspecified asthma, uncomplicated: Secondary | ICD-10-CM | POA: Insufficient documentation

## 2017-11-10 HISTORY — DX: Unspecified asthma, uncomplicated: J45.909

## 2017-11-10 LAB — BASIC METABOLIC PANEL
Anion gap: 10 (ref 5–15)
BUN: 10 mg/dL (ref 6–20)
CO2: 21 mmol/L — ABNORMAL LOW (ref 22–32)
Calcium: 8.8 mg/dL — ABNORMAL LOW (ref 8.9–10.3)
Chloride: 103 mmol/L (ref 101–111)
Creatinine, Ser: 0.78 mg/dL (ref 0.44–1.00)
GFR calc Af Amer: >60 mL/min (ref >60)
GFR calc non Af Amer: >60 mL/min (ref >60)
Glucose, Bld: 92 mg/dL (ref 65–99)
Potassium: 3.9 mmol/L (ref 3.5–5.1)
Sodium: 134 mmol/L — ABNORMAL LOW (ref 135–145)

## 2017-11-10 LAB — TROPONIN I
Troponin I: <0.03 ng/mL (ref <0.03)
Troponin I: <0.03 ng/mL (ref <0.03)

## 2017-11-10 LAB — D-DIMER, QUANTITATIVE (NOT AT ARMC): D-Dimer, Quant: <0.27 ug/mL-FEU (ref 0.00–0.50)

## 2017-11-10 LAB — CBC
HCT: 35.5 % — ABNORMAL LOW (ref 36.0–46.0)
Hemoglobin: 11.7 g/dL — ABNORMAL LOW (ref 12.0–15.0)
MCH: 25.5 pg — ABNORMAL LOW (ref 26.0–34.0)
MCHC: 33 g/dL (ref 30.0–36.0)
MCV: 77.5 fL — ABNORMAL LOW (ref 78.0–100.0)
Platelets: 300 10*3/uL (ref 150–400)
RBC: 4.58 MIL/uL (ref 3.87–5.11)
RDW: 15 % (ref 11.5–15.5)
WBC: 9.9 10*3/uL (ref 4.0–10.5)

## 2017-11-10 MED ORDER — ALBUTEROL SULFATE HFA 108 (90 BASE) MCG/ACT IN AERS
INHALATION_SPRAY | RESPIRATORY_TRACT | Status: AC
  Administered 2017-11-10: 2
  Filled 2017-11-10: qty 6.7

## 2017-11-10 MED ORDER — GI COCKTAIL ~~LOC~~
30.0000 mL | Freq: Once | ORAL | Status: AC
  Administered 2017-11-10: 30 mL via ORAL
  Filled 2017-11-10: qty 30

## 2017-11-10 NOTE — ED Notes (Signed)
ED Provider at bedside. 

## 2017-11-10 NOTE — ED Notes (Signed)
Family at bedside. 

## 2017-11-10 NOTE — ED Provider Notes (Signed)
[facility] EMERGENCY DEPARTMENT Provider Note   CSN: [id number] Arrival date & time: 11/10/17  1551     History   Chief Complaint Chief Complaint  Patient presents with  . Palpitations    HPI Angela Vang is a 32 y.o. female.  Patient is a 32 year old female with a history of anxiety, asthma and ADHD who presents with palpitations.  She states since yesterday she has been feeling like her hearts been racing.  This is associated with tightness in the center of her chest and some shortness of breath.  She states the tightness is in the center of her chest and is nonradiating.  She has no pleuritic type chest pain.  The pain is nonexertional.  It is been fairly constant since yesterday.  She denies any leg pain or swelling.  She has a little bit of cough but is nonproductive.  No fevers.  She was seen in urgent care and sent here for further evaluation.  She had some similar symptoms while she was pregnant in 2014.  She was seen by Dr. Jens Som at that time.  She had an echocardiogram which was normal.  She has a 30-day Holter monitor which was normal.  She does take Vyvanse which she has been on for about 2 months but has not had any issues with that.  She does say that she has had some similar symptoms in the past with her asthma but has tried her inhaler without improvement in symptoms.  She has an albuterol rescue inhaler which is powder inhalation.  She does not like the powder because it makes her cough.  She does have a nebulizer machine at home which she rarely uses.  She also does have some reflux from time to time but it was worse this morning.  She denies any diaphoresis.  No nausea.  She is a former smoker but quit about 5 years ago.  She has no family history of early heart disease.     Past Medical History:  Diagnosis Date  . ADHD (attention deficit hyperactivity disorder)   . Anxiety   . Asthma   . Calculus of kidney   . Depression   . Herpes    cold sores no  vag lesions  . History of chickenpox   . Hx of anorexia nervosa   . Hx of gonorrhea    age 89  . Hx of varicella   . Multiple pregnancy loss, not currently pregnant 08/05/2011   x 2 first trimester  sees dr Pennie Rushing   . Palpitations 03/27/2013  . Telogen hair loss     Patient Active Problem List   Diagnosis Date Noted  . Asthma 03/10/2016  . Acute sinusitis with symptoms greater than 10 days 06/11/2014  . Medication management 06/11/2014  . Hair loss 02/19/2014  . 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 Attention deficit hyperactivity disorder (ADHD), combined type 02/19/2014  . Calculus of kidney 02/05/2014  . Allergic rhinitis 02/05/2014  . Impetigo 02/05/2014  . S/P cesarean section 05/23/2013  . Personal history of previous postdates pregnancy 05/21/2013  . Multiple pregnancy loss, not currently pregnant 08/05/2011  . ADHD (attention deficit hyperactivity disorder) 08/02/2011  . Attention deficit disorder 08/02/2011    Past Surgical History:  Procedure Laterality Date  . CESAREAN SECTION N/A 05/22/2013   Procedure: Primary Cesarean Section Delivery Baby Girl @ 2137, Apgars 8/9;  Surgeon: Zelphia Cairo, MD;  Location: [facility];  Service: Obstetrics;  Laterality: N/A;  . DILATION AND  CURETTAGE OF UTERUS    . KIDNEY STONE SURGERY     with stent  . WISDOM TOOTH EXTRACTION       OB History    Gravida  3   Para  1   Term  1   Preterm  0   AB  2   Living  1     SAB  2   TAB  0   Ectopic  0   Multiple  0   Live Births  1        Obstetric Comments  2 miscarriages         Home Medications    Prior to Admission medications   Medication Sig Start Date End Date Taking? Authorizing Provider  cetirizine (ZYRTEC) 10 MG tablet Take 10 mg by mouth daily.    [provider]  fluticasone (FLONASE) 50 MCG/ACT nasal spray Place 1 spray into both nostrils as needed for allergies or rhinitis.    [provider]  HYDROcodone-homatropine (HYCODAN)  5-1.5 MG/5ML syrup Take 5 mLs by mouth every 8 (eight) hours as needed for cough. 03/27/17   Nafziger, Kandee Keen, NP  ipratropium-albuterol (DUONEB) 0.5-2.5 (3) MG/3ML SOLN Take 3 mLs by nebulization every 6 (six) hours as needed. 03/27/17   Nafziger, Kandee Keen, NP  Multiple Vitamins-Minerals (MULTIVITAMIN PO) Take by mouth.    [provider]  VYVANSE 20 MG capsule  10/01/17   [provider]    Family History Family History  Problem Relation Age of Onset  . Diabetes Mother   . Hypertension Mother   . Irritable bowel syndrome Mother   . Depression Father   . Bipolar disorder Father   . Leukemia Maternal Grandmother   . Mental illness Brother        suicide att  . Mental illness Paternal Grandfather        suicide att  . Breast cancer Maternal Aunt     Social History Social History   Tobacco Use  . Smoking status: Former Smoker    Types: Cigarettes    Last attempt to quit: 09/13/2012    Years since quitting: 5.1  . Smokeless tobacco: Never Used  . Tobacco comment: occasional when drinking  Substance Use Topics  . Alcohol use: Yes    Alcohol/week: 0.0 oz  . Drug use: No     Allergies   Amoxicillin   Review of Systems Review of Systems  Constitutional: Negative for chills, diaphoresis, fatigue and fever.  HENT: Negative for congestion, rhinorrhea and sneezing.   Eyes: Negative.   Respiratory: Positive for cough, chest tightness and shortness of breath.   Cardiovascular: Positive for palpitations. Negative for chest pain and leg swelling.  Gastrointestinal: Negative for abdominal pain, blood in stool, diarrhea, nausea and vomiting.  Genitourinary: Negative for difficulty urinating, flank pain, frequency and hematuria.  Musculoskeletal: Negative for arthralgias and back pain.  Skin: Negative for rash.  Neurological: Negative for dizziness, speech difficulty, weakness, numbness and headaches.     Physical Exam Updated Vital Signs BP 110/76 (BP Location:  Right Arm)   Pulse 94   Temp 98.8 F (37.1 C) (Oral)   Resp 20   Ht 5' 6.5" (1.689 m)   Wt 99.3 kg (219 lb)   LMP 10/22/2017   SpO2 100%   BMI 34.82 kg/m   Physical Exam  Constitutional: She is oriented to person, place, and time. She appears well-developed and well-nourished.  HENT:  Head: Normocephalic and atraumatic.  Eyes: Pupils are equal,  round, and reactive to light.  Neck: Normal range of motion. Neck supple.  Cardiovascular: Normal rate, regular rhythm and normal heart sounds.  Pulmonary/Chest: Effort normal and breath sounds normal. No respiratory distress. She has no wheezes. She has no rales. She exhibits no tenderness.  Abdominal: Soft. Bowel sounds are normal. There is no tenderness. There is no rebound and no guarding.  Musculoskeletal: Normal range of motion. She exhibits no edema.  No edema or calf tenderness  Lymphadenopathy:    She has no cervical adenopathy.  Neurological: She is alert and oriented to person, place, and time.  Skin: Skin is warm and dry. No rash noted.  Psychiatric: She has a normal mood and affect.     ED Treatments / Results  Labs (all labs ordered are listed, but only abnormal results are displayed) Labs Reviewed  BASIC METABOLIC PANEL - Abnormal; Notable for the following components:      Result Value   Sodium 134 (*)    CO2 21 (*)    Calcium 8.8 (*)    All other components within normal limits  CBC - Abnormal; Notable for the following components:   Hemoglobin 11.7 (*)    HCT 35.5 (*)    MCV 77.5 (*)    MCH 25.5 (*)    All other components within normal limits  TROPONIN I  D-DIMER, QUANTITATIVE (NOT AT [facility])  TROPONIN I    EKG EKG Interpretation  Date/Time:  Saturday November 10 2017 17:11:00 EDT Ventricular Rate:  97 PR Interval:  128 QRS Duration: 89 QT Interval:  339 QTC Calculation: 431 R Axis:   33 Text Interpretation:  Sinus rhythm Nonspecific T abnormalities, diffuse leads similar to EKG from same day Confirmed  by Rolan BuccoBelfi, Rhiann Boucher [phone]([id number]) on 11/10/2017 5:17:37 PM   Radiology Dg Chest 2 View  Result Date: 11/10/2017 CLINICAL DATA:  Cardiac palpitations EXAM: CHEST - 2 VIEW COMPARISON:  10/02/2017 FINDINGS: The heart size and mediastinal contours are within normal limits. Both lungs are clear. The visualized skeletal structures are unremarkable. IMPRESSION: No active cardiopulmonary disease. Electronically Signed   By: Alcide CleverMark  Lukens M.D.   On: 11/10/2017 17:32    Procedures Procedures (including critical care time)  Medications Ordered in ED Medications  albuterol (PROVENTIL HFA;VENTOLIN HFA) 108 (90 Base) MCG/ACT inhaler (2 puffs  Given 11/10/17 1658)  gi cocktail (Maalox,Lidocaine,Donnatal) (30 mLs Oral Given 11/10/17 1817)     Initial Impression / Assessment and Plan / ED Course  I have reviewed the triage vital signs and the nursing notes.  Pertinent labs & imaging results that were available during my care of the patient were reviewed by me and considered in my medical decision making (see chart for details).     Patient is a 32 year old female who presents with palpitations associated with some chest pain shortness of breath.  It is in the center of her chest and nonradiating.  There is no exertional symptoms.  She has no ischemic changes noted on EKG.  She has had 2- troponins.  Her d-dimer is normal.  She has some mild tachycardia which has resolved in the ED but no other symptoms consistent with a pulmonary embolus.  No hypoxia.  Her chest x-ray is clear without evidence of pneumonia or pneumothorax.  She feels better after GI cocktail.  She also was dispensed a new albuterol inhaler to use at home.  Her lungs are clear on exam.  She was discharged home in good condition.  She was encouraged to have  close follow-up with her PCP.  She was advised to start taking omeprazole which she can get over-the-counter to see if this helps her symptoms as well.  Return precautions were given.  Final Clinical  Impressions(s) / ED Diagnoses   Final diagnoses:  Chest pain, unspecified type    ED Discharge Orders    None       Rolan Bucco, MD 11/10/17 2032

## 2017-11-10 NOTE — ED Triage Notes (Signed)
Palpitations and SOB since yesterday. Sent from [facility].

## 2017-11-10 NOTE — ED Notes (Signed)
Patient transported to X-ray 

## 2017-11-15 ENCOUNTER — Encounter: Payer: Self-pay | Admitting: Adult Health

## 2017-11-15 ENCOUNTER — Encounter: Payer: Self-pay | Admitting: Family Medicine

## 2017-11-15 ENCOUNTER — Encounter

## 2017-11-15 ENCOUNTER — Ambulatory Visit: Payer: BLUE CROSS/BLUE SHIELD | Admitting: Adult Health

## 2017-11-15 VITALS — BP 110/80 | HR 104 | Temp 98.2°F | Wt 222.0 lb

## 2017-11-15 DIAGNOSIS — R0602 Shortness of breath: Secondary | ICD-10-CM

## 2017-11-15 DIAGNOSIS — R002 Palpitations: Secondary | ICD-10-CM | POA: Diagnosis not present

## 2017-11-15 NOTE — Progress Notes (Signed)
Subjective:    Patient ID: Angela Vang, female    DOB: 1986/01/13, 32 y.o.   MRN: [id number]  HPI 32 year old female who  has a past medical history of ADHD (attention deficit hyperactivity disorder), Anxiety, Asthma, Calculus of kidney, Depression, Herpes, History of chickenpox, anorexia nervosa, gonorrhea, varicella, Multiple pregnancy loss, not currently pregnant (08/05/2011), Palpitations (03/27/2013), and Telogen hair loss.  She presents to the office today for ER follow-up regarding chest pain or shortness of breath.  He was originally seen at urgent care but was sent over to the emergency room.  Per patient in urgent care she had an EKG which showed some tachycardia, which was the reason why she was sent to the emergency room   Her symptoms started 1 day prior to ER visit.  She had associated tightness in the center of her chest and some shortness of breath.  Tightness was nonradiating and the pain was nonexertional.  She did have a slight cough that was nonproductive.  No fevers.   The ER her work-up was negative.  EKG showed no ischemic changes with normal sinus rhythm, the cardia had resolved.  Her d-dimer was normal, and she had troponin x2 which were both negative.  Her chest x-ray was clear without evidence of pneumonia or pneumothorax.  Is discharged in good condition and asked to follow-up PCP.  She was asked to start omeprazole over-the-counter, and was taken off Vyvanse.   Day she continues to complain of a pretty constant chest pain and shortness of breath.  She describes her chest pain is "feeling as though my heart is racing".  Shortness of breath is described as feeling as though something is sitting on her chest.  She continues to have a dry cough and she has stopped Vyvanse and has started omeprazole.  She does use her inhaler but has not noticed any difference in her symptoms during usage.  He does endorse some mild epigastric discomfort, but would not describe it as pain.   She denies any burning sensation OR waking up with a sour taste in her mouth  Review of Systems See HPI   Past Medical History:  Diagnosis Date  . ADHD (attention deficit hyperactivity disorder)   . Anxiety   . Asthma   . Calculus of kidney   . Depression   . Herpes    cold sores no vag lesions  . History of chickenpox   . Hx of anorexia nervosa   . Hx of gonorrhea    age 32  . Hx of varicella   . Multiple pregnancy loss, not currently pregnant 08/05/2011   x 2 first trimester  sees dr Pennie Rushing   . Palpitations 03/27/2013  . Telogen hair loss     Social History   Socioeconomic History  . Marital status: Married    Spouse name: Not on file  . Number of children: 1  . Years of education: Not on file  . Highest education level: Not on file  Occupational History  . Occupation: Programme researcher, broadcasting/film/video  Social Needs  . Financial resource strain: Not on file  . Food insecurity:    Worry: Not on file    Inability: Not on file  . Transportation needs:    Medical: Not on file    Non-medical: Not on file  Tobacco Use  . Smoking status: Former Smoker    Types: Cigarettes    Last attempt to quit: 09/13/2012    Years since quitting: 5.1  .  Smokeless tobacco: Never Used  . Tobacco comment: occasional when drinking  Substance and Sexual Activity  . Alcohol use: Yes    Alcohol/week: 0.0 oz  . Drug use: No  . Sexual activity: Yes    Birth control/protection: None  Lifestyle  . Physical activity:    Days per week: Not on file    Minutes per session: Not on file  . Stress: Not on file  Relationships  . Social connections:    Talks on phone: Not on file    Gets together: Not on file    Attends religious service: Not on file    Active member of club or organization: Not on file    Attends meetings of clubs or organizations: Not on file    Relationship status: Not on file  . Intimate partner violence:    Fear of current or ex partner: Not on file    Emotionally abused: Not on  file    Physically abused: Not on file    Forced sexual activity: Not on file  Other Topics Concern  . 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    32 yo goes to daycare   HH   3 husband and infant 2 dogs and a  Cat  hh of        Past Surgical History:  Procedure Laterality Date  . CESAREAN SECTION N/A 05/22/2013   Procedure: Primary Cesarean Section Delivery Baby Girl @ 2137, Apgars 8/9;  Surgeon: Zelphia Cairo, MD;  Location: [facility];  Service: Obstetrics;  Laterality: N/A;  . DILATION AND CURETTAGE OF UTERUS    . KIDNEY STONE SURGERY     with stent  . WISDOM TOOTH EXTRACTION      Family History  Problem Relation Age of Onset  . Diabetes Mother   . Hypertension Mother   . Irritable bowel syndrome Mother   . Depression Father   . Bipolar disorder Father   . Leukemia Maternal Grandmother   . Mental illness Brother        suicide att  . Mental illness Paternal Grandfather        suicide att  . Breast cancer Maternal Aunt     Allergies  Allergen Reactions  . Amoxicillin Itching    Current Outpatient Medications on File Prior to Visit  Medication Sig Dispense Refill  . cetirizine (ZYRTEC) 10 MG tablet Take 10 mg by mouth daily.    . fluticasone (FLONASE) 50 MCG/ACT nasal spray Place 1 spray into both nostrils as needed for allergies or rhinitis.    Marland Kitchen ipratropium-albuterol (DUONEB) 0.5-2.5 (3) MG/3ML SOLN Take 3 mLs by nebulization every 6 (six) hours as needed. 360 mL 1  . Multiple Vitamins-Minerals (MULTIVITAMIN PO) Take by mouth.    Marland Kitchen omeprazole (PRILOSEC OTC) 20 MG tablet Take 20 mg by mouth daily.    Marland Kitchen VYVANSE 20 MG capsule      Current Facility-Administered Medications on File Prior to Visit  Medication Dose Route Frequency Provider Last Rate Last Dose  . ipratropium-albuterol  (DUONEB) 0.5-2.5 (3) MG/3ML nebulizer solution 3 mL  3 mL Nebulization Q6H Tiandre Teall, NP   3 mL at 03/27/17 1018    BP 110/80  Temp 98.2 F (36.8 C) (Oral)   Wt 222 lb (100.7 kg)   LMP 10/22/2017   BMI 35.29 kg/m       Objective:   Physical Exam  Constitutional: She is oriented to person, place, and time. She appears well-developed and well-nourished. No distress.  Cardiovascular: Regular rhythm, normal heart sounds, intact distal pulses and normal pulses.  No extrasystoles are present. Tachycardia present. PMI is not displaced. Exam reveals no gallop and no friction rub.  No murmur heard. Pulmonary/Chest: Effort normal and breath sounds normal. No stridor. No respiratory distress. She has no wheezes. She has no rales. She exhibits no tenderness.  Dry Cough    Abdominal: Soft. Bowel sounds are normal. She exhibits no distension and no mass. There is tenderness. There is no rebound and no guarding. No hernia.  Musculoskeletal: She exhibits no edema, tenderness or deformity.  Neurological: She is alert and oriented to person, place, and time.  Skin: Skin is warm and dry. Capillary refill takes less than 2 seconds. No rash noted. She is not diaphoretic. No erythema. No pallor.  Psychiatric: She has a normal mood and affect. Her behavior is normal. Judgment and thought content normal.  Nursing note and vitals reviewed.     Assessment & Plan:  Because of her symptoms.  Posible GERD related but that does not explain episodes of tachycardia.  Dimer was negative so doubt PE.  She has been on Vyvanse for close to 3 months I do not think that this would be the causative factor.  Will get Holter monitor for further evaluation and consider audiology consult in the future.  Continue with omeprazole for possible GERD related issues.  Lungs are clear on exam, not concerned for pulmonary causative factor.  She does not drink any caffeine and does not smoke.  There is no illicit drug use.  Have  her follow-up after Holter monitor.  Of course return to the clinic or emergency room as needed  Shirline Freesory Uriah Philipson, NP

## 2017-11-21 ENCOUNTER — Inpatient Hospital Stay: Payer: BLUE CROSS/BLUE SHIELD | Admitting: Adult Health

## 2018-06-18 DIAGNOSIS — R5383 Other fatigue: Secondary | ICD-10-CM | POA: Diagnosis not present

## 2018-08-28 ENCOUNTER — Other Ambulatory Visit: Payer: Self-pay

## 2018-08-28 ENCOUNTER — Ambulatory Visit: Payer: BLUE CROSS/BLUE SHIELD | Admitting: Adult Health

## 2018-08-28 ENCOUNTER — Ambulatory Visit: Payer: Self-pay

## 2018-08-28 ENCOUNTER — Ambulatory Visit (INDEPENDENT_AMBULATORY_CARE_PROVIDER_SITE_OTHER): Payer: BLUE CROSS/BLUE SHIELD | Admitting: Adult Health

## 2018-08-28 ENCOUNTER — Encounter: Payer: Self-pay | Admitting: Adult Health

## 2018-08-28 VITALS — BP 122/80 | HR 123 | Temp 98.5°F

## 2018-08-28 DIAGNOSIS — J029 Acute pharyngitis, unspecified: Secondary | ICD-10-CM

## 2018-08-28 DIAGNOSIS — R6889 Other general symptoms and signs: Secondary | ICD-10-CM

## 2018-08-28 LAB — POCT RAPID STREP A (OFFICE): Rapid Strep A Screen: NEGATIVE

## 2018-08-28 MED ORDER — AZITHROMYCIN 250 MG PO TABS: ORAL_TABLET | ORAL | 0 refills | Status: DC

## 2018-08-28 NOTE — Telephone Encounter (Signed)
Incoming call from Patient who complains of Dry cough,runny nose, temps of 99.5 -100.1 feeling tired.  Denies Exposure that she is aware of.  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  None report exposure at this point.  Patient states that she get allergies this time of year.  Uses Flonase  And Zyrtec.  Occasional SOB  , Coughing. Patient scheduled for appointment this afternoon.  08/28/18@ 3:00pm.  Patient is to arrive at 2:45 pm.  Voices understanding. Appointment is with Shirline Frees, NP                Reason for Disposition . [1] Taking antihistamines > 2 days AND [2] nasal allergy symptoms interfere with sleep, school, or work  Answer Assessment - Initial Assessment Questions 1. SYMPTOM: "What's the main symptom you're concerned about?" (e.g., runny nose, stuffiness, sneezing, itching)     Runny nose, sneezing  eyeitching  2. SEVERITY: "How bad is it?" "What does it keep you from doing?" (e.g., sleeping, working)      Yes , more tired  3. EYES: "Are the eyes also red, watery, and itchy?"     Eyes itching 4. TRIGGER: "What pollen or other allergic substance do you think is causing the symptoms?"      Hay fever 5. TREATMENT: "What medicine are you using?" "What medicine worked best in the past?"     Flonase, zyryec 6. OTHER SYMPTOMS: "Do you have any other symptoms?" (e.g., coughing, difficulty breathing, wheezing)     Coughinging, SOB,  7. PREGNANCY: "Is there any chance you are pregnant?" "When was your last menstrual period?"     March 11th  Protocols used: NASAL ALLERGIES (HAY FEVER)-A-AH

## 2018-08-28 NOTE — Progress Notes (Signed)
Subjective:    Patient ID: Angela Vang, female    DOB: 1986-06-12, 33 y.o.   MRN: [id number]  URI   This is a new problem. Episode onset: 3 days. The maximum temperature recorded prior to her arrival was 100.4 - 100.9 F. The fever has been present for 1 to 2 days. Associated symptoms include congestion, coughing, headaches, nausea, rhinorrhea, a sore throat and swollen glands. Pertinent negatives include no chest pain, diarrhea, ear pain, plugged ear sensation, rash, sinus pain, vomiting or wheezing. She has tried nothing for the symptoms.     Review of Systems  Constitutional: Positive for activity change, appetite change, fatigue and fever.  HENT: Positive for congestion, rhinorrhea and sore throat. Negative for ear pain, nosebleeds, sinus pressure and sinus pain.   Respiratory: Positive for cough and shortness of breath. Negative for chest tightness and wheezing.   Cardiovascular: Negative for chest pain.  Gastrointestinal: Positive for nausea. Negative for diarrhea and vomiting.  Genitourinary: Negative.   Skin: Negative for rash.  Neurological: Positive for headaches.   Past Medical History:  Diagnosis Date  . ADHD (attention deficit hyperactivity disorder)   . Anxiety   . Asthma   . Calculus of kidney   . Depression   . Herpes    cold sores no vag lesions  . History of chickenpox   . Hx of anorexia nervosa   . Hx of gonorrhea    age 62  . Hx of varicella   . Multiple pregnancy loss, not currently pregnant 08/05/2011   x 2 first trimester  sees dr Pennie Rushing   . Palpitations 03/27/2013  . Telogen hair loss     Social History   Socioeconomic History  . Marital status: Married    Spouse name: Not on file  . Number of children: 1  . Years of education: Not on file  . Highest education level: Not on file  Occupational History  . Occupation: Programme researcher, broadcasting/film/video  Social Needs  . Financial resource strain: Not on file  . Food insecurity:    Worry: Not on file   Inability: Not on file  . Transportation needs:    Medical: Not on file    Non-medical: Not on file  Tobacco Use  . Smoking status: Former Smoker    Types: Cigarettes    Last attempt to quit: 09/13/2012    Years since quitting: 5.9  . Smokeless tobacco: Never Used  . Tobacco comment: occasional when drinking  Substance and Sexual Activity  . Alcohol use: Yes    Alcohol/week: 0.0 standard drinks  . Drug use: No  . Sexual activity: Yes    Birth control/protection: None  Lifestyle  . Physical activity:    Days per week: Not on file    Minutes per session: Not on file  . Stress: Not on file  Relationships  . Social connections:    Talks on phone: Not on file    Gets together: Not on file    Attends religious service: Not on file    Active member of club or organization: Not on file    Attends meetings of clubs or organizations: Not on file    Relationship status: Not on file  . Intimate partner violence:    Fear of current or ex partner: Not on file    Emotionally abused: Not on file    Physically abused: Not on file    Forced sexual activity: Not on file  Other Topics Concern  .  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    33 yo goes to daycare   HH   3 husband and infant 2 dogs and a  Cat  hh of        Past Surgical History:  Procedure Laterality Date  . CESAREAN SECTION N/A 05/22/2013   Procedure: Primary Cesarean Section Delivery Baby Girl @ 2137, Apgars 8/9;  Surgeon: Zelphia Cairo, MD;  Location: [facility];  Service: Obstetrics;  Laterality: N/A;  . DILATION AND CURETTAGE OF UTERUS    . KIDNEY STONE SURGERY     with stent  . WISDOM TOOTH EXTRACTION      Family History  Problem Relation Age of Onset  . Diabetes Mother   . Hypertension Mother   . Irritable bowel syndrome Mother    . Depression Father   . Bipolar disorder Father   . Leukemia Maternal Grandmother   . Mental illness Brother        suicide att  . Mental illness Paternal Grandfather        suicide att  . Breast cancer Maternal Aunt     Allergies  Allergen Reactions  . Amoxicillin Itching    Current Outpatient Medications on File Prior to Visit  Medication Sig Dispense Refill  . cetirizine (ZYRTEC) 10 MG tablet Take 10 mg by mouth daily.    . fluticasone (FLONASE) 50 MCG/ACT nasal spray Place 1 spray into both nostrils as needed for allergies or rhinitis.    Marland Kitchen ipratropium-albuterol (DUONEB) 0.5-2.5 (3) MG/3ML SOLN Take 3 mLs by nebulization every 6 (six) hours as needed. 360 mL 1  . Multiple Vitamins-Minerals (MULTIVITAMIN PO) Take by mouth.    Marland Kitchen omeprazole (PRILOSEC OTC) 20 MG tablet Take 20 mg by mouth daily.    Marland Kitchen VYVANSE 20 MG capsule      Current Facility-Administered Medications on File Prior to Visit  Medication Dose Route Frequency Provider Last Rate Last Dose  . ipratropium-albuterol (DUONEB) 0.5-2.5 (3) MG/3ML nebulizer solution 3 mL  3 mL Nebulization Q6H Climmie Buelow, NP   3 mL at 03/27/17 1018    BP 122/80 (BP Location: Left Arm, Patient Position: Sitting, Cuff Size: Normal)   Pulse (!) 123   Temp 98.5 F (36.9 C) (Oral)   SpO2 98%       Objective:   Physical Exam Vitals signs and nursing note reviewed.  Constitutional:      Appearance: Normal appearance. She is ill-appearing.  HENT:     Nose: Congestion and rhinorrhea present. Rhinorrhea is clear.     Mouth/Throat:     Mouth: Mucous membranes are moist.     Pharynx: Oropharynx is clear. Uvula midline.     Tonsils: Tonsillar exudate present. Swelling: 2+ on the right. 2+ on the left.  Cardiovascular:     Rate and Rhythm: Normal rate and regular rhythm.     Pulses: Normal pulses.     Heart sounds: Normal heart sounds.  Pulmonary:     Effort: Pulmonary effort is normal.     Breath sounds: Normal breath sounds.   Neurological:     Mental Status: She is alert.  Psychiatric:        Mood and Affect: Mood normal.  Behavior: Behavior normal.        Thought Content: Thought content normal.        Judgment: Judgment normal.       Assessment & Plan:  1. Flu-like symptoms - Novel Coronavirus, NAA ([facility])  [place] testing site only - POC Influenza A/B- negative  -Advised self quarantine until followed up on about [facility] 19 testing. -Take Tylenol as needed for symptom relief  2. Sore throat -But strep negative but will treat due to signs and symptoms with azithromycin. -Salt water gargles - azithromycin (ZITHROMAX Z-PAK) 250 MG tablet; Take 2 tablets on Day 1.  Then take 1 tablet daily.  Dispense: 6 tablet; Refill: 0 - POC Rapid Strep A- negative   Shirline Frees, NP

## 2018-08-28 NOTE — Patient Instructions (Signed)
Person Under Monitoring Name: Angela Vang  Location: [address] [place] [address]   Infection Prevention Recommendations for Individuals Confirmed to have, or Being Evaluated for, 2019 Novel Coronavirus (COVID-19) Infection Who Receive Care at Home  Individuals who are confirmed to have, or are being evaluated for, COVID-19 should follow the prevention steps below until a healthcare provider or local or state health department says they can return to normal activities.  Stay home except to get medical care You should restrict activities outside your home, except for getting medical care. Do not go to work, school, or public areas, and do not use public transportation or taxis.  Call ahead before visiting your doctor Before your medical appointment, call the healthcare provider and tell them that you have, or are being evaluated for, COVID-19 infection. This will help the healthcare providers office take steps to keep other people from getting infected. Ask your healthcare provider to call the local or state health department.  Monitor your symptoms Seek prompt medical attention if your illness is worsening (e.g., difficulty breathing). Before going to your medical appointment, call the healthcare provider and tell them that you have, or are being evaluated for, COVID-19 infection. Ask your healthcare provider to call the local or state health department.  Wear a facemask You should wear a facemask that covers your nose and mouth when you are in the same room with other people and when you visit a healthcare provider. People who live with or visit you should also wear a facemask while they are in the same room with you.  Separate yourself from other people in your home As much as possible, you should stay in a different room from other people in your home. Also, you should use a separate bathroom, if available.  Avoid sharing household items You should not share  dishes, drinking glasses, cups, eating utensils, towels, bedding, or other items with other people in your home. After using these items, you should wash them thoroughly with soap and water.  Cover your coughs and sneezes Cover your mouth and nose with a tissue when you cough or sneeze, or you can cough or sneeze into your sleeve. Throw used tissues in a lined trash can, and immediately wash your hands with soap and water for at least 20 seconds or use an alcohol-based hand rub.  Wash your [employer] your hands often and thoroughly with soap and water for at least 20 seconds. You can use an alcohol-based hand sanitizer if soap and water are not available and if your hands are not visibly dirty. Avoid touching your eyes, nose, and mouth with unwashed hands.   Prevention Steps for Caregivers and Household Members of Individuals Confirmed to have, or Being Evaluated for, COVID-19 Infection Being Cared for in the Home  If you live with, or provide care at home for, a person confirmed to have, or being evaluated for, COVID-19 infection please follow these guidelines to prevent infection:  Follow healthcare providers instructions Make sure that you understand and can help the patient follow any healthcare provider instructions for all care.  Provide for the patients basic needs You should help the patient with basic needs in the home and provide support for getting groceries, prescriptions, and other personal needs.  Monitor the patients symptoms If they are getting sicker, call his or her medical provider and tell them that the patient has, or is being evaluated for, COVID-19 infection. This will help the healthcare providers office  take steps to keep other people from getting infected. Ask the healthcare provider to call the local or state health department.  Limit the number of people who have contact with the patient  If possible, have only one caregiver for the patient.  Other  household members should stay in another home or place of residence. If this is not possible, they should stay  in another room, or be separated from the patient as much as possible. Use a separate bathroom, if available.  Restrict visitors who do not have an essential need to be in the home.  Keep older adults, very young children, and other sick people away from the patient Keep older adults, very young children, and those who have compromised immune systems or chronic health conditions away from the patient. This includes people with chronic heart, lung, or kidney conditions, diabetes, and cancer.  Ensure good ventilation Make sure that shared spaces in the home have good air flow, such as from an air conditioner or an opened window, weather permitting.  Wash your hands often  Wash your hands often and thoroughly with soap and water for at least 20 seconds. You can use an alcohol based hand sanitizer if soap and water are not available and if your hands are not visibly dirty.  Avoid touching your eyes, nose, and mouth with unwashed hands.  Use disposable paper towels to dry your hands. If not available, use dedicated cloth towels and replace them when they become wet.  Wear a facemask and gloves  Wear a disposable facemask at all times in the room and gloves when you touch or have contact with the patients blood, body fluids, and/or secretions or excretions, such as sweat, saliva, sputum, nasal mucus, vomit, urine, or feces.  Ensure the mask fits over your nose and mouth tightly, and do not touch it during use.  Throw out disposable facemasks and gloves after using them. Do not reuse.  Wash your hands immediately after removing your facemask and gloves.  If your personal clothing becomes contaminated, carefully remove clothing and launder. Wash your hands after handling contaminated clothing.  Place all used disposable facemasks, gloves, and other waste in a lined container before  disposing them with other household waste.  Remove gloves and wash your hands immediately after handling these items.  Do not share dishes, glasses, or other household items with the patient  Avoid sharing household items. You should not share dishes, drinking glasses, cups, eating utensils, towels, bedding, or other items with a patient who is confirmed to have, or being evaluated for, COVID-19 infection.  After the person uses these items, you should wash them thoroughly with soap and water.  Wash laundry thoroughly  Immediately remove and wash clothes or bedding that have blood, body fluids, and/or secretions or excretions, such as sweat, saliva, sputum, nasal mucus, vomit, urine, or feces, on them.  Wear gloves when handling laundry from the patient.  Read and follow directions on labels of laundry or clothing items and detergent. In general, wash and dry with the warmest temperatures recommended on the label.  Clean all areas the individual has used often  Clean all touchable surfaces, such as counters, tabletops, doorknobs, bathroom fixtures, toilets, phones, keyboards, tablets, and bedside tables, every day. Also, clean any surfaces that may have blood, body fluids, and/or secretions or excretions on them.  Wear gloves when cleaning surfaces the patient has come in contact with.  Use a diluted bleach solution (e.g., dilute bleach with 1 part  bleach and 10 parts water) or a household disinfectant with a label that says EPA-registered for coronaviruses. To make a bleach solution at home, add 1 tablespoon of bleach to 1 quart (4 cups) of water. For a larger supply, add  cup of bleach to 1 gallon (16 cups) of water.  Read labels of cleaning products and follow recommendations provided on product labels. Labels contain instructions for safe and effective use of the cleaning product including precautions you should take when applying the product, such as wearing gloves or eye protection  and making sure you have good ventilation during use of the product.  Remove gloves and wash hands immediately after cleaning.  Monitor yourself for signs and symptoms of illness Caregivers and household members are considered close contacts, should monitor their health, and will be asked to limit movement outside of the home to the extent possible. Follow the monitoring steps for close contacts listed on the symptom monitoring form.   ? If you have additional questions, contact your local health department or call the epidemiologist on call at [phone] (available 24/7). ? This guidance is subject to change. For the most up-to-date guidance from [facility], please refer to their website: TripMetro.hu     Person Under Monitoring Name: Angela Vang  Location: [address] [place] [address]   CORONAVIRUS DISEASE 2019 (COVID-19) Guidance for Persons Under Investigation You are being tested for the virus that causes coronavirus disease 2019 (COVID-19). Public health actions are necessary to ensure protection of your health and the health of others, and to prevent further spread of infection. COVID-19 is caused by a virus that can cause symptoms, such as fever, cough, and shortness of breath. The primary transmission from person to person is by coughing or sneezing. On July 11, 2018, the World Health Organization announced a [employer] Emergency of International Concern and on July 12, 2018 the U.S. Department of Health and Human Services declared a public health emergency. If the virus that causesCOVID-19 spreads in the community, it could have severe public health consequences.  As a person under investigation for COVID-19, the [employer] of Health and [employer], Division of [employer] advises you to adhere to the following guidance until your test results are reported to you. If your test result  is positive, you will receive additional information from your provider and your local health department at that time.   Remain at home until you are cleared by your health provider or public health authorities.   Keep a log of visitors to your home using the form provided. Any visitors to your home must be aware of your isolation status.  If you plan to move to a new address or leave the county, notify the local health department in your county.  Call a doctor or seek care if you have an urgent medical need. Before seeking medical care, call ahead and get instructions from the provider before arriving at the medical office, clinic or hospital. Notify them that you are being tested for the virus that causes COVID-19 so arrangements can be made, as necessary, to prevent transmission to others in the healthcare setting. Next, notify the local health department in your county.  If a medical emergency arises and you need to call 911, inform the first responders that you are being tested for the virus that causes COVID-19. Next, notify the local health department in your county.  Adhere to all guidance set forth by the [employer] of  Public Health for Home Care of patients that is based on guidance from the Center for Disease Control and Prevention with suspected or confirmed COVID-19. It is provided with this guidance for Persons Under Investigation.  Your health and the health of our community are our top priorities. Public Health officials remain available to provide assistance and counseling to you about COVID-19 and compliance with this guidance.  Provider: ____________________________________________________________ Date: ______/_____/_________  By signing below, you acknowledge that you have read and agree to comply with this Guidance for Persons Under Investigation. ______________________________________________________________ Date: ______/_____/_________  WHO DO I CALL? You  can find a list of local health departments here: http://dean.org/https://www.ncdhhs.gov/divisions/public-health/county-healthdepartments Health Department: ____________________________________________________________________ Contact Name: ________________________________________________________________________ Telephone: ___________________________________________________________________________  [place] DHHS, Division of Public Health, Communicable Disease Branch COVID-19 Guidance for Persons Under Investigation August 17, 2018  Person Under Monitoring Name: Zigmund Gottronmanda L Zollinger  Location: [address] [place] [id number]   Record here the list of visitors to your home since you became ill with respiratory symptoms that led you to consult a health provider:  Visitor Name Date Time In Time Out Did this person come within 6 feet of you? Indicate Y or N Relationship to Person Under Monitoring Phone number Comments   ___/____/____ __:__ AM/PM __:__ AM/PM       ___/____/____ __:__ AM/PM __:__ AM/PM       ___/____/____ __:__ AM/PM __:__ AM/PM       ___/____/____ __:__ AM/PM __:__ AM/PM       ___/____/____ __:__ AM/PM __:__ AM/PM       ___/____/____ __:__ AM/PM __:__ AM/PM       ___/____/____ __:__ AM/PM __:__ AM/PM       ___/____/____ __:__ AM/PM __:__ AM/PM       ___/____/____ __:__ AM/PM __:__ AM/PM       ___/____/____ __:__ AM/PM __:__ AM/PM       ___/____/____ __:__ AM/PM __:__ AM/PM       ___/____/____ __:__ AM/PM __:__ AM/PM       ___/____/____ __:__ AM/PM __:__ AM/PM       ___/____/____ __:__ AM/PM __:__ AM/PM       [place] DHHS, Division of Public Health, Communicable Disease Branch

## 2018-08-29 ENCOUNTER — Telehealth: Payer: Self-pay | Admitting: Adult Health

## 2018-08-29 ENCOUNTER — Telehealth: Payer: Self-pay | Admitting: Emergency Medicine

## 2018-08-29 DIAGNOSIS — R6889 Other general symptoms and signs: Secondary | ICD-10-CM | POA: Diagnosis not present

## 2018-08-29 DIAGNOSIS — R002 Palpitations: Secondary | ICD-10-CM

## 2018-08-29 LAB — POCT INFLUENZA A/B
Influenza A, POC: NEGATIVE
Influenza B, POC: NEGATIVE

## 2018-08-29 NOTE — Telephone Encounter (Signed)
Patient seen yesterday for influenza like symptoms.  She reported that she continues to have feeling of palpitations quite often and feels as though her heart is racing. She was originally ordered for Holter monitor in 11/2017 but was not able to get fit for it due to work conflict.   She would like to get rescheduled for this

## 2018-09-01 ENCOUNTER — Other Ambulatory Visit: Payer: Self-pay | Admitting: Adult Health

## 2018-09-01 DIAGNOSIS — J029 Acute pharyngitis, unspecified: Secondary | ICD-10-CM

## 2018-09-02 ENCOUNTER — Telehealth: Payer: Self-pay

## 2018-09-02 ENCOUNTER — Other Ambulatory Visit: Payer: Self-pay | Admitting: Adult Health

## 2018-09-02 DIAGNOSIS — R058 Other specified cough: Secondary | ICD-10-CM

## 2018-09-02 DIAGNOSIS — R05 Cough: Secondary | ICD-10-CM

## 2018-09-02 LAB — NOVEL CORONAVIRUS, NAA: SARS-CoV-2, NAA: NOT DETECTED

## 2018-09-02 MED ORDER — LEVOFLOXACIN 500 MG PO TABS: 500.0000 mg | ORAL_TABLET | Freq: Every day | ORAL | 0 refills | Status: DC

## 2018-09-02 NOTE — Telephone Encounter (Signed)
Kandee Keen is out of the office today.

## 2018-09-02 NOTE — Telephone Encounter (Signed)
Called and spoke with pt and she stated that Kandee Keen wrote her going back to work on 3/24--she wanted to see if Dr. Clent Ridges would write her out from 3/24  Through the weekend.   She would also like to have a cough med sent to the pharmacy to help control the cough.

## 2018-09-02 NOTE — Telephone Encounter (Signed)
Call in Levaquin 500 mg daily for 10 days  

## 2018-09-02 NOTE — Telephone Encounter (Signed)
Patient calling with a follow up question from her conversation with Junious Dresser. Please advise.

## 2018-09-02 NOTE — Telephone Encounter (Signed)
COVID-19 test results in.

## 2018-09-02 NOTE — Telephone Encounter (Signed)
I can do a note but I need dates to put in it. (when was her first day out and when will she return ?)

## 2018-09-02 NOTE — Telephone Encounter (Signed)
Pt calling back to check status on hearing back.

## 2018-09-02 NOTE — Telephone Encounter (Signed)
Pt is aware of rx that has been sent to the pharmacy.   She is requesting a note for work.  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  Dr. Clent Ridges please advise. thanks

## 2018-09-02 NOTE — Telephone Encounter (Signed)
Dr. Fry please advise. Thanks  

## 2018-09-02 NOTE — Telephone Encounter (Signed)
Copied from CRM [id number]. Topic: General - Inquiry >> Sep 02, 2018  8:15 AM Crist Infante wrote: Reason for CRM: Pt seen 3/18 by [facility].  Given a zpak, finished that.  Pt states her sx are worse. She has a severe cough, producing yellow mucus. Fever on and off, low grade highest 100.6.  pt reports getting chills, headache. Would like either to be seen or call back to advise. Pt was tested for covid -19 and flu. All negative. Please advise Kandee Keen not in the office today.

## 2018-09-03 ENCOUNTER — Other Ambulatory Visit: Payer: Self-pay | Admitting: Adult Health

## 2018-09-03 ENCOUNTER — Telehealth: Payer: Self-pay | Admitting: Family Medicine

## 2018-09-03 DIAGNOSIS — R059 Cough, unspecified: Secondary | ICD-10-CM

## 2018-09-03 DIAGNOSIS — R05 Cough: Secondary | ICD-10-CM

## 2018-09-03 MED ORDER — HYDROCODONE-HOMATROPINE 5-1.5 MG/5ML PO SYRP: 5.0000 mL | ORAL_SOLUTION | Freq: Three times a day (TID) | ORAL | 0 refills | Status: DC | PRN

## 2018-09-03 MED ORDER — PREDNISONE 10 MG PO TABS: ORAL_TABLET | ORAL | 0 refills | Status: DC

## 2018-09-03 NOTE — Telephone Encounter (Signed)
I will let Cory handle this  

## 2018-09-03 NOTE — Telephone Encounter (Signed)
Spoke to the pt.  She was given an antibiotic yesterday by Dr. Clent Ridges.  Pt states she feels worse than when seen last week.  Would like a note to stay out of work until Monday.  Also requesting a cough medication.  Please advise.

## 2018-09-04 ENCOUNTER — Encounter: Payer: Self-pay | Admitting: Family Medicine

## 2018-09-04 NOTE — Telephone Encounter (Signed)
Negative for Covid 19  OK to write note for one week out of work

## 2018-09-04 NOTE — Telephone Encounter (Signed)
CV-19 NEGATIVE

## 2018-09-04 NOTE — Telephone Encounter (Signed)
Spoke to the pt.  Faxed her work note to [phone].  Received confirmation the transaction was successful.  She will pick up cough syrup from the pharmacy.  Nothing further needed at this time.

## 2018-09-04 NOTE — Telephone Encounter (Signed)
Patient is calling again to get the status of her last message.  Patient stated that she is still feeling bad and would also like a note for work.  CB# [phone]

## 2018-10-15 ENCOUNTER — Telehealth: Payer: Self-pay | Admitting: *Deleted

## 2018-10-15 NOTE — Telephone Encounter (Signed)
[employer] to mail a 3 day ZIO XT long term holter monitor to your home as a COVID 19 replacement for the 48 hour holter monitor ordered by Angela Peng NP.  Instructions revieweds briefly as the are included in the monitor kit.

## 2018-11-14 ENCOUNTER — Ambulatory Visit (INDEPENDENT_AMBULATORY_CARE_PROVIDER_SITE_OTHER): Payer: BLUE CROSS/BLUE SHIELD

## 2018-11-14 DIAGNOSIS — R002 Palpitations: Secondary | ICD-10-CM

## 2018-11-26 ENCOUNTER — Other Ambulatory Visit: Payer: Self-pay

## 2018-11-29 ENCOUNTER — Other Ambulatory Visit: Payer: Self-pay | Admitting: Family Medicine

## 2018-11-29 DIAGNOSIS — R000 Tachycardia, unspecified: Secondary | ICD-10-CM

## 2019-02-13 ENCOUNTER — Ambulatory Visit: Payer: BC Managed Care – PPO | Admitting: Cardiovascular Disease

## 2019-04-10 ENCOUNTER — Encounter: Payer: Self-pay | Admitting: Nurse Practitioner

## 2019-04-24 ENCOUNTER — Ambulatory Visit: Payer: BC Managed Care – PPO | Admitting: Nurse Practitioner

## 2019-05-11 DIAGNOSIS — Z20828 Contact with and (suspected) exposure to other viral communicable diseases: Secondary | ICD-10-CM | POA: Diagnosis not present

## 2019-05-11 DIAGNOSIS — J029 Acute pharyngitis, unspecified: Secondary | ICD-10-CM | POA: Diagnosis not present

## 2019-05-11 DIAGNOSIS — R05 Cough: Secondary | ICD-10-CM | POA: Diagnosis not present

## 2019-05-14 ENCOUNTER — Other Ambulatory Visit: Payer: Self-pay

## 2019-05-14 ENCOUNTER — Telehealth (INDEPENDENT_AMBULATORY_CARE_PROVIDER_SITE_OTHER): Payer: BC Managed Care – PPO | Admitting: Adult Health

## 2019-05-14 DIAGNOSIS — J0140 Acute pansinusitis, unspecified: Secondary | ICD-10-CM

## 2019-05-14 MED ORDER — DOXYCYCLINE HYCLATE 100 MG PO CAPS: 100.0000 mg | ORAL_CAPSULE | Freq: Two times a day (BID) | ORAL | 0 refills | Status: DC

## 2019-05-14 NOTE — Progress Notes (Signed)
Virtual Visit via Video Note  I connected with Angela Vang on 05/14/19 at  2:30 PM EST by a video enabled telemedicine application and verified that I am speaking with the correct person using two identifiers.  Location patient: home Location provider:work or home office Persons participating in the virtual visit: patient, provider  I discussed the limitations of evaluation and management by telemedicine and the availability of in person appointments. The patient expressed understanding and agreed to proceed.   HPI: 33 year old female who is being evaluated today for probable sinusitis.  She reports that she developed a sore throat and sinus pain and pressure last weekend.  She was seen at urgent care and her flu and strep came back negative.  She was started on azithromycin and has completed this medication but as in the past she did not respond to it.  She reports that her sore throat has resolved but she continues to have sinus pain and pressure and a semiproductive cough with chills.  She denies fevers.  She feels as though symptoms are worsening   ROS: See pertinent positives and negatives per HPI.  Past Medical History:  Diagnosis Date  . ADHD (attention deficit hyperactivity disorder)   . Anxiety   . Asthma   . Calculus of kidney   . Depression   . Herpes    cold sores no vag lesions  . History of chickenpox   . Hx of anorexia nervosa   . Hx of gonorrhea    age 78  . Hx of varicella   . Multiple pregnancy loss, not currently pregnant 08/05/2011   x 2 first trimester  sees dr Pennie Rushing   . Palpitations 03/27/2013  . Telogen hair loss     Past Surgical History:  Procedure Laterality Date  . CESAREAN SECTION N/A 05/22/2013   Procedure: Primary Cesarean Section Delivery Baby Girl @ 2137, Apgars 8/9;  Surgeon: Zelphia Cairo, MD;  Location: [facility];  Service: Obstetrics;  Laterality: N/A;  . DILATION AND CURETTAGE OF UTERUS    . KIDNEY STONE SURGERY     with stent  .  WISDOM TOOTH EXTRACTION      Family History  Problem Relation Age of Onset  . Diabetes Mother   . Hypertension Mother   . Irritable bowel syndrome Mother   . Depression Father   . Bipolar disorder Father   . Leukemia Maternal Grandmother   . Mental illness Brother        suicide att  . Mental illness Paternal Grandfather        suicide att  . Breast cancer Maternal Aunt      Current Outpatient Medications:  .  azithromycin (ZITHROMAX Z-PAK) 250 MG tablet, Take 2 tablets on Day 1.  Then take 1 tablet daily., Disp: 6 tablet, Rfl: 0 .  cetirizine (ZYRTEC) 10 MG tablet, Take 10 mg by mouth daily., Disp: , Rfl:  .  fluticasone (FLONASE) 50 MCG/ACT nasal spray, Place 1 spray into both nostrils as needed for allergies or rhinitis., Disp: , Rfl:  .  HYDROcodone-homatropine (HYCODAN) 5-1.5 MG/5ML syrup, Take 5 mLs by mouth every 8 (eight) hours as needed for cough., Disp: 120 mL, Rfl: 0 .  ipratropium-albuterol (DUONEB) 0.5-2.5 (3) MG/3ML SOLN, TAKE 3 MLS BY NEBULIZATION EVERY 6 (SIX) HOURS AS NEEDED., Disp: 360 mL, Rfl: 1 .  levofloxacin (LEVAQUIN) 500 MG tablet, Take 1 tablet (500 mg total) by mouth daily., Disp: 10 tablet, Rfl: 0 .  Multiple Vitamins-Minerals (MULTIVITAMIN PO), Take by  mouth., Disp: , Rfl:  .  omeprazole (PRILOSEC OTC) 20 MG tablet, Take 20 mg by mouth daily., Disp: , Rfl:  .  predniSONE (DELTASONE) 10 MG tablet, 40 mg x 3 days, 20 mg x 3 days, 10 mg x 3 days, Disp: 21 tablet, Rfl: 0 .  VYVANSE 20 MG capsule, , Disp: , Rfl:   Current Facility-Administered Medications:  .  ipratropium-albuterol (DUONEB) 0.5-2.5 (3) MG/3ML nebulizer solution 3 mL, 3 mL, Nebulization, Q6H, Dannae Kato, NP, 3 mL at 03/27/17 1018  EXAM:  VITALS per patient if applicable:  GENERAL: alert, oriented, appears well and in no acute distress  HEENT: atraumatic, conjunttiva clear, no obvious abnormalities on inspection of external nose and ears  NECK: normal movements of the head and  neck  LUNGS: on inspection no signs of respiratory distress, breathing rate appears normal, no obvious gross SOB, gasping or wheezing  CV: no obvious cyanosis  MS: moves all visible extremities without noticeable abnormality  PSYCH/NEURO: pleasant and cooperative, no obvious depression or anxiety, speech and thought processing grossly intact  ASSESSMENT AND PLAN:  Discussed the following assessment and plan: -Likely antibiotic failure as she has had in the past.  She has responded well to doxycycline for past sinusitis symptoms.  Will prescribe this.  She was advised to stay hydrated and rest.  Follow-up if no improvement in the next 72 hours  Acute non-recurrent pansinusitis - Plan: doxycycline (VIBRAMYCIN) 100 MG capsule   I discussed the assessment and treatment plan with the patient. The patient was provided an opportunity to ask questions and all were answered. The patient agreed with the plan and demonstrated an understanding of the instructions.   The patient was advised to call back or seek an in-person evaluation if the symptoms worsen or if the condition fails to improve as anticipated.   Angela Peng, NP

## 2019-05-19 ENCOUNTER — Ambulatory Visit: Payer: Self-pay

## 2019-05-19 NOTE — Telephone Encounter (Signed)
Pt. Reports she has been on Doxycycline x 5 days and is having nausea every time she takes it. Taking it with and without food. Reports her sinusitis symptoms and cough are no better. Please advise pt.   Answer Assessment - Initial Assessment Questions 1.   NAME of MEDICATION: "What medicine are you calling about?"     Doxycycline  2.   QUESTION: "What is your question?"     Having nausea with the antibiotic 3.   PRESCRIBING HCP: "Who prescribed it?" Reason: if prescribed by specialist, call should be referred to that group.     Sinus, upper respiratory 4. SYMPTOMS: "Do you have any symptoms?"     Cough, nasal congestion 5. SEVERITY: If symptoms are present, ask "Are they mild, moderate or severe?"     Moderate 6.  PREGNANCY:  "Is there any chance that you are pregnant?" "When was your last menstrual period?"     No  Protocols used: MEDICATION QUESTION CALL-A-AH

## 2019-05-20 ENCOUNTER — Other Ambulatory Visit: Payer: Self-pay | Admitting: Adult Health

## 2019-05-20 MED ORDER — LEVOFLOXACIN 500 MG PO TABS: 500.0000 mg | ORAL_TABLET | Freq: Every day | ORAL | 0 refills | Status: DC

## 2019-05-20 NOTE — Telephone Encounter (Signed)
Left a message for a return call.

## 2019-05-20 NOTE — Telephone Encounter (Signed)
I have sent in levaquin to [facility]. OK to stop doxycyline

## 2019-05-21 NOTE — Telephone Encounter (Signed)
Left a message for a return call.

## 2019-05-23 NOTE — Telephone Encounter (Signed)
Left a message for a return call.  Unable to reach pt.  Will now close note.

## 2019-07-25 DIAGNOSIS — Z6838 Body mass index (BMI) 38.0-38.9, adult: Secondary | ICD-10-CM | POA: Diagnosis not present

## 2019-07-25 DIAGNOSIS — Z309 Encounter for contraceptive management, unspecified: Secondary | ICD-10-CM | POA: Diagnosis not present

## 2019-07-25 DIAGNOSIS — Z01419 Encounter for gynecological examination (general) (routine) without abnormal findings: Secondary | ICD-10-CM | POA: Diagnosis not present

## 2019-07-25 DIAGNOSIS — N6459 Other signs and symptoms in breast: Secondary | ICD-10-CM | POA: Diagnosis not present

## 2019-07-28 ENCOUNTER — Other Ambulatory Visit: Payer: Self-pay | Admitting: Obstetrics and Gynecology

## 2019-07-28 DIAGNOSIS — N6459 Other signs and symptoms in breast: Secondary | ICD-10-CM

## 2019-07-30 ENCOUNTER — Other Ambulatory Visit: Payer: BC Managed Care – PPO

## 2019-08-26 ENCOUNTER — Telehealth: Payer: BC Managed Care – PPO | Admitting: Adult Health

## 2019-08-27 ENCOUNTER — Other Ambulatory Visit: Payer: Self-pay

## 2019-08-27 ENCOUNTER — Telehealth (INDEPENDENT_AMBULATORY_CARE_PROVIDER_SITE_OTHER): Payer: BC Managed Care – PPO | Admitting: Adult Health

## 2019-08-27 DIAGNOSIS — F419 Anxiety disorder, unspecified: Secondary | ICD-10-CM | POA: Diagnosis not present

## 2019-08-27 DIAGNOSIS — S93402A Sprain of unspecified ligament of left ankle, initial encounter: Secondary | ICD-10-CM | POA: Diagnosis not present

## 2019-08-27 DIAGNOSIS — F329 Major depressive disorder, single episode, unspecified: Secondary | ICD-10-CM

## 2019-08-27 DIAGNOSIS — F32A Depression, unspecified: Secondary | ICD-10-CM

## 2019-08-27 MED ORDER — ESCITALOPRAM OXALATE 10 MG PO TABS: 10.0000 mg | ORAL_TABLET | Freq: Every day | ORAL | 0 refills | Status: DC

## 2019-08-27 NOTE — Progress Notes (Signed)
Virtual Visit via Telephone Note  I connected with Angela Vang on 08/27/19 at 11:00 AM EDT by telephone and verified that I am speaking with the correct person using two identifiers.   I discussed the limitations, risks, security and privacy concerns of performing an evaluation and management service by telephone and the availability of in person appointments. I also discussed with the patient that there may be a patient responsible charge related to this service. The patient expressed understanding and agreed to proceed.  Location patient: home Location provider: work or home office Participants present for the call: patient, provider Patient did not have a visit in the prior 7 days to address this/these issue(s).   History of Present Illness: 34 year old female who  has a past medical history of ADHD (attention deficit hyperactivity disorder), Anxiety, Asthma, Calculus of kidney, Depression, Herpes, History of chickenpox, anorexia nervosa, gonorrhea, varicella, Multiple pregnancy loss, not currently pregnant (08/05/2011), Palpitations (03/27/2013), and Telogen hair loss.  He is being evaluated today for anxiety and depression.  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  Over the last few months her depression has been becoming worse she has frequent crying spells does not want to do anything and does not feel as though she wants to get out of bed.  She denies an active suicidal plan and reports "I would never do anything because of my daughter but I do have these thoughts that race through my had and I do not see anything that is good anymore".  She has been missing a lot of work because of the depression  She has tried contacting multiple counseling centers in psychiatrists but they are all doing video visits and she does  not feel as this would be helpful.  She cannot disclose all the information to them since she works from home and so is her husband.  In the past she was on Lexapro for postpartum depression and feels as though she benefited greatly from this medication but she is wondering if she can recent be restarted on this   Observations/Objective: Patient tearful on the phone. . I do not appreciate any SOB. Speech and thought processing are grossly intact. Patient reported vitals:  Assessment and Plan: 1. Anxiety and depression -We will prescribe Lexapro 10 mg.  She was advised to follow-up in 2 weeks and in 4 weeks.  If she needs anything sooner she knows to follow-up then.  If she develops active suicidal ideation then she needs to stop the medication and go to the emergency room. - escitalopram (LEXAPRO) 10 MG tablet; Take 1 tablet (10 mg total) by mouth daily.  Dispense: 30 tablet; Refill: 0   Follow Up Instructions:   I did not refer this patient for an OV in the next 24 hours for this/these issue(s).  I discussed the assessment and treatment plan with the patient. The patient was provided an opportunity to ask questions and all were answered. The patient agreed with the plan and demonstrated an understanding of the instructions.   The patient was advised to call back or seek an in-person evaluation if the symptoms worsen or if the condition fails to improve as anticipated.  I provided 23 minutes of non-face-to-face time during this encounter.   Shirline Frees, NP

## 2019-08-29 ENCOUNTER — Encounter: Payer: Self-pay | Admitting: Adult Health

## 2019-09-02 ENCOUNTER — Telehealth: Payer: Self-pay | Admitting: Adult Health

## 2019-09-02 NOTE — Telephone Encounter (Signed)
Spoke to the pt and advised that I have not received her FMLA forms.  Pt states she will fax again. 

## 2019-09-02 NOTE — Telephone Encounter (Signed)
Pt husband called and informed that FMLA paperwork was faxed again today. Pt husband also informed that he needs to be completed today. Advised pt husband that we did receive the fax.

## 2019-09-02 NOTE — Telephone Encounter (Signed)
Pt notified to pick up paper work at the front desk.  Nothing further needed.

## 2019-09-02 NOTE — Telephone Encounter (Signed)
Pt would like to know if we have received FMLA paper work. Pt faxed over FMLA paper work on 08/28/19. Pt would also, like to double check that she wrote down return date to work 09/07/19. Thanks

## 2019-09-02 NOTE — Telephone Encounter (Signed)
Spoke to the pt and advised that I have not received her FMLA forms.  Pt states she will fax again.

## 2019-09-11 ENCOUNTER — Telehealth (INDEPENDENT_AMBULATORY_CARE_PROVIDER_SITE_OTHER): Payer: BC Managed Care – PPO | Admitting: Adult Health

## 2019-09-11 ENCOUNTER — Other Ambulatory Visit: Payer: Self-pay

## 2019-09-11 VITALS — Temp 98.7°F | Wt 233.0 lb

## 2019-09-11 DIAGNOSIS — F329 Major depressive disorder, single episode, unspecified: Secondary | ICD-10-CM | POA: Diagnosis not present

## 2019-09-11 DIAGNOSIS — F32A Depression, unspecified: Secondary | ICD-10-CM

## 2019-09-11 DIAGNOSIS — F419 Anxiety disorder, unspecified: Secondary | ICD-10-CM

## 2019-09-11 NOTE — Progress Notes (Signed)
Virtual Visit via Telephone Note  I connected with Angela Vang on 09/11/19 at 10:30 AM EDT by telephone and verified that I am speaking with the correct person using two identifiers.   I discussed the limitations, risks, security and privacy concerns of performing an evaluation and management service by telephone and the availability of in person appointments. I also discussed with the patient that there may be a patient responsible charge related to this service. The patient expressed understanding and agreed to proceed.  Location patient: home Location provider: work or home office Participants present for the call: patient, provider Patient did not have a visit in the prior 7 days to address this/these issue(s).   History of Present Illness: 34 year old female who is being evaluated today for 2-week follow-up regarding anxiety and depression.  2 weeks ago she was started on Lexapro 10 mg due to worsening anxiety and depression that was stemming from issues at work as well as with her husband.  Today she reports that she has noticed that her anxiety is getting better as well as the depression.  He has been out of work and feels as though this is helpful as well.  She reports that her husband has commented that he felt as though the depression was improving since she started the medication.  She is having less crying spells.   Observations/Objective: Patient sounds cheerful and well on the phone. I do not appreciate any SOB. Speech and thought processing are grossly intact. Patient reported vitals:  Assessment and Plan: 1. Anxiety and depression -I am hopeful that the Lexapro 10 mg will continue to benefit her.  She did sound better on the phone today than she did 2 weeks ago.  We will keep her on this dose and have her follow-up in 2 more weeks at that time we can consider increasing Lexapro to 20 mg.  Was advised to follow-up sooner if she needs anything   Follow Up  Instructions:  I did not refer this patient for an OV in the next 24 hours for this/these issue(s).  I discussed the assessment and treatment plan with the patient. The patient was provided an opportunity to ask questions and all were answered. The patient agreed with the plan and demonstrated an understanding of the instructions.   The patient was advised to call back or seek an in-person evaluation if the symptoms worsen or if the condition fails to improve as anticipated.  I provided 11 minutes of non-face-to-face time during this encounter.   Shirline Frees, NP

## 2019-09-19 ENCOUNTER — Other Ambulatory Visit: Payer: Self-pay | Admitting: Adult Health

## 2019-09-19 DIAGNOSIS — F329 Major depressive disorder, single episode, unspecified: Secondary | ICD-10-CM

## 2019-09-19 DIAGNOSIS — F419 Anxiety disorder, unspecified: Secondary | ICD-10-CM

## 2019-09-23 NOTE — Telephone Encounter (Signed)
Pt has follow up on 09/25/19

## 2019-09-25 ENCOUNTER — Other Ambulatory Visit: Payer: Self-pay

## 2019-09-25 ENCOUNTER — Encounter: Payer: Self-pay | Admitting: Adult Health

## 2019-09-25 ENCOUNTER — Ambulatory Visit (INDEPENDENT_AMBULATORY_CARE_PROVIDER_SITE_OTHER): Payer: BC Managed Care – PPO | Admitting: Adult Health

## 2019-09-25 VITALS — Temp 98.6°F | Wt 231.0 lb

## 2019-09-25 DIAGNOSIS — F419 Anxiety disorder, unspecified: Secondary | ICD-10-CM

## 2019-09-25 DIAGNOSIS — F329 Major depressive disorder, single episode, unspecified: Secondary | ICD-10-CM

## 2019-09-25 DIAGNOSIS — B009 Herpesviral infection, unspecified: Secondary | ICD-10-CM

## 2019-09-25 MED ORDER — VALACYCLOVIR HCL 1 G PO TABS: 1000.0000 mg | ORAL_TABLET | Freq: Two times a day (BID) | ORAL | 3 refills | Status: AC

## 2019-09-25 NOTE — Progress Notes (Signed)
Virtual Visit via Telephone Note  I connected with Angela Vang on 09/25/19 at  8:00 AM EDT by telephone and verified that I am speaking with the correct person using two identifiers.   I discussed the limitations, risks, security and privacy concerns of performing an evaluation and management service by telephone and the availability of in person appointments. I also discussed with the patient that there may be a patient responsible charge related to this service. The patient expressed understanding and agreed to proceed.  Location patient: home Location provider: work or home office Participants present for the call: patient, provider Patient did not have a visit in the prior 7 days to address this/these issue(s).   History of Present Illness: 34 year old female who  has a past medical history of ADHD (attention deficit hyperactivity disorder), Anxiety, Asthma, Calculus of kidney, Depression, Herpes, History of chickenpox, anorexia nervosa, gonorrhea, varicella, Multiple pregnancy loss, not currently pregnant (08/05/2011), Palpitations (03/27/2013), and Telogen hair loss.  She is being evaluated today for 4-week follow-up regarding anxiety depression.  Last month she has been on Lexapro 10 mg daily.  At  the 2-week mark she did notice some improvement overall symptoms, was having less anxiety and depression as well as crying spells.  Over the last 2 weeks she has noticed more improvement.  She reports that her emotions are much more stable and she is even gone back to work and has having no difficulty with this.  He would like to stay on Lexapro 10 mg for the time being.  Strongly, she reports recently developed for cold sores along her lip, which is abnormal for her if she does not get cold sores on a frequent basis.   Observations/Objective: Patient sounds cheerful and well on the phone. I do not appreciate any SOB. Speech and thought processing are grossly intact. Patient reported  vitals:  Assessment and Plan: 1. Anxiety and depression -I am glad to hear that she is doing much better.  We will keep her on Lexapro 10 mg for the time being.  She was advised to follow-up if her symptoms worsen.  2. HSV-1 (herpes simplex virus 1) infection  - valACYclovir (VALTREX) 1000 MG tablet; Take 1 tablet (1,000 mg total) by mouth 2 (two) times daily for 10 days.  Dispense: 20 tablet; Refill: 3   Follow Up Instructions:  I did not refer this patient for an OV in the next 24 hours for this/these issue(s).  I discussed the assessment and treatment plan with the patient. The patient was provided an opportunity to ask questions and all were answered. The patient agreed with the plan and demonstrated an understanding of the instructions.   The patient was advised to call back or seek an in-person evaluation if the symptoms worsen or if the condition fails to improve as anticipated.  I provided 15 minutes of non-face-to-face time during this encounter.   Shirline Frees, NP

## 2019-09-25 NOTE — Telephone Encounter (Signed)
Sent to the pharmacy by e-scribe for 6 months per Cory. 

## 2019-10-07 ENCOUNTER — Other Ambulatory Visit: Payer: Self-pay

## 2019-10-07 ENCOUNTER — Telehealth (INDEPENDENT_AMBULATORY_CARE_PROVIDER_SITE_OTHER): Payer: BC Managed Care – PPO | Admitting: Adult Health

## 2019-10-07 VITALS — Temp 98.6°F | Wt 235.0 lb

## 2019-10-07 DIAGNOSIS — F419 Anxiety disorder, unspecified: Secondary | ICD-10-CM

## 2019-10-07 DIAGNOSIS — F329 Major depressive disorder, single episode, unspecified: Secondary | ICD-10-CM | POA: Diagnosis not present

## 2019-10-07 DIAGNOSIS — F32A Depression, unspecified: Secondary | ICD-10-CM

## 2019-10-07 MED ORDER — ALPRAZOLAM 0.25 MG PO TABS: 0.2500 mg | ORAL_TABLET | Freq: Two times a day (BID) | ORAL | 1 refills | Status: DC | PRN

## 2019-10-07 NOTE — Progress Notes (Signed)
Virtual Visit via Telephone Note  I connected with Angela Vang on 10/07/19 at  4:30 PM EDT by telephone and verified that I am speaking with the correct person using two identifiers.   I discussed the limitations, risks, security and privacy concerns of performing an evaluation and management service by telephone and the availability of in person appointments. I also discussed with the patient that there may be a patient responsible charge related to this service. The patient expressed understanding and agreed to proceed.  Location patient: home Location provider: work or home office Participants present for the call: patient, provider Patient did not have a visit in the prior 7 days to address this/these issue(s).   History of Present Illness: 34 year old female who is being evaluated today for worsening anxiety and depression.  She was last seen on 09/25/2019 for 4-week follow-up after starting Lexapro 10 mg daily.  At the 2-week mark she did notice some improvements in her overall symptoms and was having less anxiety and depression as well as crying spells.  The following 2 weeks she noticed more improvement and she had even gone back to work and he was having no difficulty with this.  We decided to keep her on Lexapro 10 mg for the time being.  Over the last week her symptoms have come back and she feels as though her world is crumbling and on her.  Since returning back to work she has been forced to work mandatory overtime which usually includes 12 to 14-hour shifts on a daily basis, he is going through failing marriage and she is trying to be a mother to her young daughter.    She is open to seeing a therapist to talk about her symptoms    Observations/Objective: Patient sounds tearful and anxious on phone  I do not appreciate any SOB. Speech and thought processing are grossly intact.   Assessment and Plan: 1. Anxiety and depression -We will increase Lexapro to 20 mg, she will  take 2 tabs of what she has at home and then notify me when she gets low.  Also prescribe low-dose Xanax to take twice daily as needed until we can get her stable.  She was given the number for low-power behavioral health and she will call and schedule an appointment - ALPRAZolam (XANAX) 0.25 MG tablet; Take 1 tablet (0.25 mg total) by mouth 2 (two) times daily as needed for anxiety.  Dispense: 60 tablet; Refill: 1 -She knows to go to the emergency room if she develops suicidal ideation   Follow Up Instructions:  I did not refer this patient for an OV in the next 24 hours for this/these issue(s).  I discussed the assessment and treatment plan with the patient. The patient was provided an opportunity to ask questions and all were answered. The patient agreed with the plan and demonstrated an understanding of the instructions.   The patient was advised to call back or seek an in-person evaluation if the symptoms worsen or if the condition fails to improve as anticipated.  I provided 12 minutes of non-face-to-face time during this encounter.   Shirline Frees, NP

## 2019-10-14 ENCOUNTER — Telehealth: Payer: Self-pay | Admitting: Family Medicine

## 2019-10-14 NOTE — Telephone Encounter (Signed)
Received form but only received questions 7 through 16.  Informed the pt that I only received part of the paper work.  She will have it resent.  While on the phone she mentioned that [employer] Keen has advised reduced hours in the past.  She would like to try this.  She currently works Monday through Thursday and those are 10 hour days.    Days out of work are 09/29/19 and returning on 10/20/19.

## 2019-10-20 ENCOUNTER — Telehealth: Payer: Self-pay | Admitting: Adult Health

## 2019-10-20 NOTE — Telephone Encounter (Signed)
error 

## 2019-10-20 NOTE — Telephone Encounter (Signed)
Pt stated she resent the medical forms and would like the return date to say May 14th 2021.   Pt is aware that PCP/CMA is off on mondays and will receive message tomorrow

## 2019-10-21 ENCOUNTER — Encounter: Payer: Self-pay | Admitting: Adult Health

## 2019-10-21 ENCOUNTER — Telehealth: Payer: Self-pay | Admitting: Adult Health

## 2019-10-21 ENCOUNTER — Other Ambulatory Visit: Payer: Self-pay

## 2019-10-21 ENCOUNTER — Telehealth (INDEPENDENT_AMBULATORY_CARE_PROVIDER_SITE_OTHER): Payer: BC Managed Care – PPO | Admitting: Adult Health

## 2019-10-21 VITALS — Temp 97.3°F | Wt 230.2 lb

## 2019-10-21 DIAGNOSIS — F419 Anxiety disorder, unspecified: Secondary | ICD-10-CM

## 2019-10-21 DIAGNOSIS — F329 Major depressive disorder, single episode, unspecified: Secondary | ICD-10-CM

## 2019-10-21 NOTE — Telephone Encounter (Signed)
Informed pt that I have not received the form(s).  She will drop off at the front desk.  Will wait to receive.

## 2019-10-21 NOTE — Telephone Encounter (Signed)
Pt came in and dropped off FMLA form to be completed by the provider.  Upon completion pt would like to be called at [phone] when the form is completed.  Form placed in providers folder for completion.

## 2019-10-21 NOTE — Progress Notes (Signed)
Virtual Visit via Telephone Note  I connected with Angela Vang on 10/21/19 at 10:30 AM EDT by telephone and verified that I am speaking with the correct person using two identifiers.   I discussed the limitations, risks, security and privacy concerns of performing an evaluation and management service by telephone and the availability of in person appointments. I also discussed with the patient that there may be a patient responsible charge related to this service. The patient expressed understanding and agreed to proceed.  Location patient: home Location provider: work or home office Participants present for the call: patient, provider Patient did not have a visit in the prior 7 days to address this/these issue(s).   History of Present Illness: 34 year old female who is being evaluated today for 2-week follow-up regarding anxiety and depression.  During her last visit she felt as though she was having worsening anxiety and depression.  She had returned back to work and was forced to work mandatory overtime which was including 12 to 14-hour shifts on a daily basis.  She was also going through a failing marriage and was trying to be a mother to her young daughter.  All of this was adding up causing her to feel as though the world was caving down on her.  At this time her Lexapro was increased from 10 mg to 20 mg and she was prescribed a short course of Xanax to use as needed.  Today she reports that she has noticed a difference since the increase in medication.  She is having more good days and bad days and feels as though her anxiety is better controlled.  She did not feel as though the Xanax was helpful and believes that it made her more agitated so she has not been taking it.  She has not reached out to behavioral health yet but plans to do so   Observations/Objective: Patient sounds cheerful and well on the phone. I do not appreciate any SOB. Speech and thought processing are grossly  intact. Patient reported vitals:  Assessment and Plan: 1. Anxiety and depression -Continue with Lexapro 20 mg daily.  She should continue to get benefit from this medication over the next 2 to 3 weeks.  Will DC Xanax from her medication list.  Advise follow-up as needed.   Follow Up Instructions:  I did not refer this patient for an OV in the next 24 hours for this/these issue(s).  I discussed the assessment and treatment plan with the patient. The patient was provided an opportunity to ask questions and all were answered. The patient agreed with the plan and demonstrated an understanding of the instructions.   The patient was advised to call back or seek an in-person evaluation if the symptoms worsen or if the condition fails to improve as anticipated.  I provided 15 minutes of non-face-to-face time during this encounter.   Shirline Frees, NP

## 2019-10-21 NOTE — Telephone Encounter (Signed)
Djimraou, Lawana T routed conversation to You 44 minutes ago (2:26 PM)  Djimraou, Lawana T 44 minutes ago (2:26 PM)  LD   Pt came in and dropped off FMLA form to be completed by the provider.  Upon completion pt would like to be called at [phone] when the form is completed.  Form placed in providers folder for completion.

## 2019-10-22 NOTE — Telephone Encounter (Signed)
Pt notified that paper work is ready for pick up.  Copy sent to scan.  Nothing further needed.

## 2019-10-22 NOTE — Telephone Encounter (Signed)
Placed in Cory's red folder. 

## 2019-10-22 NOTE — Telephone Encounter (Signed)
Left a message for a return call.

## 2019-10-22 NOTE — Telephone Encounter (Signed)
[  11:06 AM] Angela Vang     She is returning to work next Tuesday the 18th and it will be full time

## 2019-10-23 ENCOUNTER — Telehealth: Payer: Self-pay | Admitting: Adult Health

## 2019-10-23 NOTE — Telephone Encounter (Signed)
Harrold Donath Winberry(pt's husband) picked up pt's paperwork- ok'd by Tonya--Nathan was in her chart as an emergency contact

## 2019-10-29 ENCOUNTER — Telehealth (INDEPENDENT_AMBULATORY_CARE_PROVIDER_SITE_OTHER): Payer: BC Managed Care – PPO | Admitting: Adult Health

## 2019-10-29 ENCOUNTER — Telehealth: Payer: Self-pay | Admitting: Adult Health

## 2019-10-29 ENCOUNTER — Other Ambulatory Visit: Payer: Self-pay

## 2019-10-29 ENCOUNTER — Encounter: Payer: Self-pay | Admitting: Adult Health

## 2019-10-29 VITALS — Temp 98.2°F | Wt 235.0 lb

## 2019-10-29 DIAGNOSIS — F329 Major depressive disorder, single episode, unspecified: Secondary | ICD-10-CM | POA: Diagnosis not present

## 2019-10-29 DIAGNOSIS — F32A Depression, unspecified: Secondary | ICD-10-CM

## 2019-10-29 DIAGNOSIS — F419 Anxiety disorder, unspecified: Secondary | ICD-10-CM

## 2019-10-29 MED ORDER — LORAZEPAM 0.5 MG PO TABS: 0.5000 mg | ORAL_TABLET | Freq: Three times a day (TID) | ORAL | 1 refills | Status: DC | PRN

## 2019-10-29 NOTE — Telephone Encounter (Signed)
Spoke to the pt.  She has an appointment today with Kandee Keen and will be discussing when to return to work.  I will need to change the information once a date has been set.  She will bring paper work by to be changed.  Nothing further needed.

## 2019-10-29 NOTE — Progress Notes (Signed)
Virtual Visit via Telephone Note  I connected with Angela Vang on 10/29/19 at 10:30 AM EDT by telephone and verified that I am speaking with the correct person using two identifiers.   I discussed the limitations, risks, security and privacy concerns of performing an evaluation and management service by telephone and the availability of in person appointments. I also discussed with the patient that there may be a patient responsible charge related to this service. The patient expressed understanding and agreed to proceed.  Location patient: home Location provider: work or home office Participants present for the call: patient, provider Patient did not have a visit in the prior 7 days to address this/these issue(s).   History of Present Illness: She is currently being treated for anxiety and depression and has had good benefit with Lexapro 20 mg.  In the past she was also prescribed a low-dose Xanax did not get any benefit from this and believe that it made her more agitated so she stopped taking it.  Recently she had a panic attack while waiting for a work email to come out.  She is wondering if she can have something low dose to help with these panic attacks when they happen.  She has been prescribed Ativan in the past and did not have any reactions to this medication.  Additionally, she is going to drop off FMLA paperwork to have an extension till the end of the month.   Observations/Objective: Patient sounds cheerful and well on the phone. I do not appreciate any SOB. Speech and thought processing are grossly intact. Patient reported vitals:  Assessment and Plan: 1. Anxiety and depression -We will prescribe a dose of Ativan to take as needed for panic attacks. - LORazepam (ATIVAN) 0.5 MG tablet; Take 1 tablet (0.5 mg total) by mouth every 8 (eight) hours as needed for anxiety.  Dispense: 30 tablet; Refill: 1 -She is going to drop off her FMLA paperwork and we will extend her  absence until November 11, 2019  Follow Up Instructions:  I did not refer this patient for an OV in the next 24 hours for this/these issue(s).  I discussed the assessment and treatment plan with the patient. The patient was provided an opportunity to ask questions and all were answered. The patient agreed with the plan and demonstrated an understanding of the instructions.   The patient was advised to call back or seek an in-person evaluation if the symptoms worsen or if the condition fails to improve as anticipated.  I provided 8 minutes of non-face-to-face time during this encounter.   Shirline Frees, NP

## 2019-10-29 NOTE — Telephone Encounter (Signed)
Pt stated the FMLA paperwork has the wrong start date and wants to see if she can change the return date   Pt can be reached at [phone]

## 2019-11-15 ENCOUNTER — Encounter: Payer: Self-pay | Admitting: Adult Health

## 2019-11-18 ENCOUNTER — Other Ambulatory Visit: Payer: Self-pay | Admitting: Adult Health

## 2019-11-18 DIAGNOSIS — F32A Depression, unspecified: Secondary | ICD-10-CM

## 2019-11-18 DIAGNOSIS — F419 Anxiety disorder, unspecified: Secondary | ICD-10-CM

## 2019-11-18 MED ORDER — ESCITALOPRAM OXALATE 20 MG PO TABS: 20.0000 mg | ORAL_TABLET | Freq: Every day | ORAL | 1 refills | Status: DC

## 2019-11-25 ENCOUNTER — Encounter: Payer: Self-pay | Admitting: Adult Health

## 2019-12-08 ENCOUNTER — Telehealth: Payer: Self-pay | Admitting: Adult Health

## 2019-12-08 NOTE — Telephone Encounter (Signed)
Pt has a Health Care Provider form needing filled out. Placed in red folder.  Pt would like to be contacted at [phone] when completed

## 2019-12-10 ENCOUNTER — Encounter: Payer: Self-pay | Admitting: Adult Health

## 2019-12-12 DIAGNOSIS — J069 Acute upper respiratory infection, unspecified: Secondary | ICD-10-CM | POA: Diagnosis not present

## 2019-12-12 DIAGNOSIS — R0981 Nasal congestion: Secondary | ICD-10-CM | POA: Diagnosis not present

## 2019-12-12 DIAGNOSIS — J029 Acute pharyngitis, unspecified: Secondary | ICD-10-CM | POA: Diagnosis not present

## 2019-12-12 NOTE — Telephone Encounter (Signed)
Pt notified to pick up at the front desk.  A copy has been sent to scan.  Nothing further needed.

## 2019-12-17 ENCOUNTER — Other Ambulatory Visit: Payer: Self-pay

## 2019-12-17 ENCOUNTER — Encounter: Payer: Self-pay | Admitting: Adult Health

## 2019-12-17 ENCOUNTER — Telehealth (INDEPENDENT_AMBULATORY_CARE_PROVIDER_SITE_OTHER): Payer: BC Managed Care – PPO | Admitting: Adult Health

## 2019-12-17 VITALS — Wt 237.0 lb

## 2019-12-17 DIAGNOSIS — R05 Cough: Secondary | ICD-10-CM | POA: Diagnosis not present

## 2019-12-17 DIAGNOSIS — R059 Cough, unspecified: Secondary | ICD-10-CM

## 2019-12-17 DIAGNOSIS — J0141 Acute recurrent pansinusitis: Secondary | ICD-10-CM

## 2019-12-17 MED ORDER — HYDROCODONE-HOMATROPINE 5-1.5 MG/5ML PO SYRP: 5.0000 mL | ORAL_SOLUTION | Freq: Three times a day (TID) | ORAL | 0 refills | Status: DC | PRN

## 2019-12-17 MED ORDER — DOXYCYCLINE HYCLATE 100 MG PO CAPS: 100.0000 mg | ORAL_CAPSULE | Freq: Two times a day (BID) | ORAL | 0 refills | Status: DC

## 2019-12-17 NOTE — Progress Notes (Signed)
Virtual Visit via Telephone Note  I connected with Angela Vang on 12/17/19 at  3:30 PM EDT by telephone and verified that I am speaking with the correct person using two identifiers.   I discussed the limitations, risks, security and privacy concerns of performing an evaluation and management service by telephone and the availability of in person appointments. I also discussed with the patient that there may be a patient responsible charge related to this service. The patient expressed understanding and agreed to proceed.  Location patient: home Location provider: work or home office Participants present for the call: patient, provider Patient did not have a visit in the prior 7 days to address this/these issue(s).   History of Present Illness: 34 year old female who  has a past medical history of ADHD (attention deficit hyperactivity disorder), Anxiety, Asthma, Calculus of kidney, Depression, Herpes, History of chickenpox, anorexia nervosa, gonorrhea, varicella, Multiple pregnancy loss, not currently pregnant (08/05/2011), Palpitations (03/27/2013), and Telogen hair loss.  She was seen at urgent care on 12/12/2019 with the complaint of nasal congestion, sore throat, and cough x1 week. She denied fevers, chills, GI symptoms. Her cough was productive with mucus and reported that it was painful to swallow but denied any difficulty swallowing. She had an rapid Covid and strep test done which were negative. She was prescribed a Z-Pak, Tessalon Perles, and a prednisone taper. She reports that she is finished these medications but is not feeling any better and is in fact feeling worse. She continues to have nasal congestion, sinus pain and pressure above and below her eyes, sore throat, and a productive cough. She does have a fever but does feel warm constantly. Continues to deny GI issues. Has not noticed any white spots on the back of her throat  Tessalon Perles do not control her cough at all and she  is not sleeping throughout the night due to cough.   Observations/Objective: Patient sounds cheerful and well on the phone. I do not appreciate any SOB. Speech and thought processing are grossly intact. Patient reported vitals:  Assessment and Plan: 1. Acute recurrent pansinusitis -Likely antibiotic failure with azithromycin. Will prescribe her doxycycline and Hycodan cough syrup. She was advised to rest and stay well-hydrated. Follow-up if no improvement in the next 4 to 5 days - doxycycline (VIBRAMYCIN) 100 MG capsule; Take 1 capsule (100 mg total) by mouth 2 (two) times daily.  Dispense: 20 capsule; Refill: 0 - HYDROcodone-homatropine (HYCODAN) 5-1.5 MG/5ML syrup; Take 5 mLs by mouth every 8 (eight) hours as needed for cough.  Dispense: 120 mL; Refill: 0  2. Cough -She was advised that Hycodan will make her drowsy so only take this at night. - HYDROcodone-homatropine (HYCODAN) 5-1.5 MG/5ML syrup; Take 5 mLs by mouth every 8 (eight) hours as needed for cough.  Dispense: 120 mL; Refill: 0   Follow Up Instructions:   I did not refer this patient for an OV in the next 24 hours for this/these issue(s).  I discussed the assessment and treatment plan with the patient. The patient was provided an opportunity to ask questions and all were answered. The patient agreed with the plan and demonstrated an understanding of the instructions.   The patient was advised to call back or seek an in-person evaluation if the symptoms worsen or if the condition fails to improve as anticipated.  I provided 20 minutes of non-face-to-face time during this encounter.   Shirline Frees, NP

## 2020-01-09 ENCOUNTER — Encounter: Payer: Self-pay | Admitting: Adult Health

## 2020-01-09 ENCOUNTER — Other Ambulatory Visit: Payer: Self-pay

## 2020-01-09 ENCOUNTER — Ambulatory Visit (INDEPENDENT_AMBULATORY_CARE_PROVIDER_SITE_OTHER): Payer: BC Managed Care – PPO | Admitting: Adult Health

## 2020-01-09 VITALS — BP 124/80 | HR 115 | Temp 98.7°F | Wt 237.8 lb

## 2020-01-09 DIAGNOSIS — F419 Anxiety disorder, unspecified: Secondary | ICD-10-CM | POA: Diagnosis not present

## 2020-01-09 DIAGNOSIS — F329 Major depressive disorder, single episode, unspecified: Secondary | ICD-10-CM

## 2020-01-09 MED ORDER — QUETIAPINE FUMARATE ER 50 MG PO TB24: 50.0000 mg | ORAL_TABLET | Freq: Every day | ORAL | 0 refills | Status: DC

## 2020-01-09 MED ORDER — QUETIAPINE FUMARATE ER 150 MG PO TB24: 150.0000 mg | ORAL_TABLET | Freq: Every day | ORAL | 0 refills | Status: DC

## 2020-01-09 NOTE — Progress Notes (Signed)
Subjective:    Patient ID: Angela Vang, female    DOB: 05-Dec-1985, 34 y.o.   MRN: [id number]  HPI 34 year old female who  has a past medical history of ADHD (attention deficit hyperactivity disorder), Anxiety, Asthma, Calculus of kidney, Depression, Herpes, History of chickenpox, anorexia nervosa, gonorrhea, varicella, Multiple pregnancy loss, not currently pregnant (08/05/2011), Palpitations (03/27/2013), and Telogen hair loss.  She presents to the office today for follow-up regarding anxiety and depression. She is currently prescribed Lexapro 20 mg. Her anxiety and depression stem from work related stressors, she works for [employer] and has having to do mandatory overtime, home stressors with her marriage, and trying to be a good mother. She feels as though Lexapro is no longer working for her and her depression is getting worse.    Also reports that her most recent FMLA was not covered because she did not have office visit so she is in fear of being terminated from her job.  She has had some passive suicidal ideation but has no plan and states "I would never go through with anything because I have to take care of my daughter".  He has not heard from behavioral health yet about an appointment.  Review of Systems See HPI   Past Medical History:  Diagnosis Date  . ADHD (attention deficit hyperactivity disorder)   . Anxiety   . Asthma   . Calculus of kidney   . Depression   . Herpes    cold sores no vag lesions  . History of chickenpox   . Hx of anorexia nervosa   . Hx of gonorrhea    age 79  . Hx of varicella   . Multiple pregnancy loss, not currently pregnant 08/05/2011   x 2 first trimester  sees dr Pennie Rushing   . Palpitations 03/27/2013  . Telogen hair loss     Social History   Socioeconomic History  . Marital status: Married    Spouse name: Not on file  . Number of children: 1  . Years of education: Not on file  . Highest education level: Not on file    Occupational History  . Occupation: [employer]  Tobacco Use  . Smoking status: Former Smoker    Types: Cigarettes    Quit date: 09/13/2012    Years since quitting: 7.3  . Smokeless tobacco: Never Used  . Tobacco comment: occasional when drinking  Substance and Sexual Activity  . Alcohol use: Yes    Alcohol/week: 0.0 standard drinks  . Drug use: No  . Sexual activity: Yes    Birth control/protection: None  Other Topics Concern  . 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    34 yo goes to daycare   HH   3 husband and infant 2 dogs and a  Cat  hh of       Social Determinants of Health   Financial Resource Strain:   . Difficulty of Paying Living Expenses:   Food Insecurity:   . Worried About Programme researcher, broadcasting/film/video in the Last Year:   . Barista in the Last Year:   Transportation Needs:   . Freight forwarder (Medical):   Marland Kitchen  Lack of Transportation (Non-Medical):   Physical Activity:   . Days of Exercise per Week:   . Minutes of Exercise per Session:   Stress:   . Feeling of Stress :   Social Connections:   . Frequency of Communication with Friends and Family:   . Frequency of Social Gatherings with Friends and Family:   . Attends Religious Services:   . Active Member of Clubs or Organizations:   . Attends Banker Meetings:   Marland Kitchen Marital Status:   Intimate Partner Violence:   . Fear of Current or Ex-Partner:   . Emotionally Abused:   Marland Kitchen Physically Abused:   . Sexually Abused:     Past Surgical History:  Procedure Laterality Date  . CESAREAN SECTION N/A 05/22/2013   Procedure: Primary Cesarean Section Delivery Baby Girl @ 2137, Apgars 8/9;  Surgeon: Zelphia Cairo, MD;  Location: [facility];  Service: Obstetrics;  Laterality: N/A;  . DILATION AND CURETTAGE OF  UTERUS    . KIDNEY STONE SURGERY     with stent  . WISDOM TOOTH EXTRACTION      Family History  Problem Relation Age of Onset  . Diabetes Mother   . Hypertension Mother   . Irritable bowel syndrome Mother   . Depression Father   . Bipolar disorder Father   . Leukemia Maternal Grandmother   . Mental illness Brother        suicide att  . Mental illness Paternal Grandfather        suicide att  . Breast cancer Maternal Aunt     Allergies  Allergen Reactions  . Amoxicillin Itching    Current Outpatient Medications on File Prior to Visit  Medication Sig Dispense Refill  . BIOTIN 5000 PO Take 5,000 mcg by mouth daily.    . cetirizine (ZYRTEC) 10 MG tablet Take 10 mg by mouth daily.    Marland Kitchen doxycycline (VIBRAMYCIN) 100 MG capsule Take 1 capsule (100 mg total) by mouth 2 (two) times daily. 20 capsule 0  . escitalopram (LEXAPRO) 20 MG tablet Take 1 tablet (20 mg total) by mouth daily. 90 tablet 1  . fluticasone (FLONASE) 50 MCG/ACT nasal spray Place 1 spray into both nostrils as needed for allergies or rhinitis.    Marland Kitchen HYDROcodone-homatropine (HYCODAN) 5-1.5 MG/5ML syrup Take 5 mLs by mouth every 8 (eight) hours as needed for cough. 120 mL 0  . ipratropium-albuterol (DUONEB) 0.5-2.5 (3) MG/3ML SOLN TAKE 3 MLS BY NEBULIZATION EVERY 6 (SIX) HOURS AS NEEDED. 360 mL 1  . LORazepam (ATIVAN) 0.5 MG tablet Take 1 tablet (0.5 mg total) by mouth every 8 (eight) hours as needed for anxiety. 30 tablet 1  . Multiple Vitamins-Minerals (MULTIVITAMIN PO) Take by mouth.    . Probiotic Product (PROBIOTIC PO) Take by mouth.     No current facility-administered medications on file prior to visit.    There were no vitals taken for this visit.      Objective:   Physical Exam Vitals and nursing note reviewed.  Constitutional:      Appearance: Normal appearance.  Cardiovascular:     Rate and Rhythm: Normal rate and regular rhythm.     Pulses: Normal pulses.     Heart sounds: Normal heart sounds.    Pulmonary:     Effort: Pulmonary effort is normal.     Breath sounds: Normal breath sounds.  Skin:    General: Skin is warm and dry.     Capillary Refill: Capillary refill takes less  than 2 seconds.  Neurological:     General: No focal deficit present.     Mental Status: She is alert and oriented to person, place, and time.  Psychiatric:        Attention and Perception: Attention and perception normal.        Mood and Affect: Mood is depressed. Affect is tearful.        Speech: Speech normal.        Behavior: Behavior normal.        Thought Content: Thought content normal. Thought content does not include homicidal or suicidal ideation. Thought content does not include homicidal or suicidal plan.        Cognition and Memory: Cognition and memory normal.        Judgment: Judgment normal.       Assessment & Plan:  1. Anxiety and depression -We will start her on Seroquel 50 mg x 7 days and then titrate her up to 150 mg.  She was given instructions on how to wean herself off Lexapro.  We will have her follow-up in 2 weeks.  She was advised if he becomes actively suicidal with a plan then she needs to go the emergency room or seek mental health evaluation at inpatient mental health.  She will follow-up with me in 2 weeks - QUEtiapine (SEROQUEL XR) 50 MG TB24 24 hr tablet; Take 1 tablet (50 mg total) by mouth at bedtime for 7 days. Then start 150 mg dose  Dispense: 7 tablet; Refill: 0 - QUEtiapine Fumarate (SEROQUEL XR) 150 MG 24 hr tablet; Take 1 tablet (150 mg total) by mouth at bedtime.  Dispense: 30 tablet; Refill: 0   Shirline Frees, NP

## 2020-01-16 ENCOUNTER — Telehealth: Payer: Self-pay | Admitting: Adult Health

## 2020-01-16 NOTE — Telephone Encounter (Signed)
Placed in Cory's red folder. 

## 2020-01-16 NOTE — Telephone Encounter (Signed)
Pt dropped off a Health Care Provider Certification Form to be filled out. Pt would like a call at [phone] to pick it up when completed.   Placed in red folder.   Aware of 3-5 business days and possible fee

## 2020-01-18 ENCOUNTER — Other Ambulatory Visit: Payer: Self-pay | Admitting: Adult Health

## 2020-01-19 NOTE — Telephone Encounter (Signed)
Pt called the office back and she is aware of the below msg

## 2020-01-19 NOTE — Telephone Encounter (Signed)
Original placed at the front desk for pick up.  A Copy has been sent to scan.  Left a message for a return call. Pt needs to be notified that paper work is available at the front desk.

## 2020-01-27 ENCOUNTER — Ambulatory Visit: Payer: BC Managed Care – PPO | Admitting: Adult Health

## 2020-01-30 ENCOUNTER — Encounter: Payer: Self-pay | Admitting: Adult Health

## 2020-02-02 ENCOUNTER — Other Ambulatory Visit: Payer: Self-pay | Admitting: Adult Health

## 2020-02-02 DIAGNOSIS — F419 Anxiety disorder, unspecified: Secondary | ICD-10-CM

## 2020-02-03 DIAGNOSIS — J301 Allergic rhinitis due to pollen: Secondary | ICD-10-CM | POA: Diagnosis not present

## 2020-02-03 DIAGNOSIS — F331 Major depressive disorder, recurrent, moderate: Secondary | ICD-10-CM | POA: Diagnosis not present

## 2020-02-03 DIAGNOSIS — R000 Tachycardia, unspecified: Secondary | ICD-10-CM | POA: Diagnosis not present

## 2020-02-03 DIAGNOSIS — J4520 Mild intermittent asthma, uncomplicated: Secondary | ICD-10-CM | POA: Diagnosis not present

## 2020-02-05 ENCOUNTER — Encounter: Payer: Self-pay | Admitting: Adult Health

## 2020-02-05 NOTE — Telephone Encounter (Signed)
Please advise 

## 2020-02-10 DIAGNOSIS — F331 Major depressive disorder, recurrent, moderate: Secondary | ICD-10-CM | POA: Diagnosis not present

## 2020-02-10 DIAGNOSIS — F902 Attention-deficit hyperactivity disorder, combined type: Secondary | ICD-10-CM | POA: Diagnosis not present

## 2020-02-14 ENCOUNTER — Other Ambulatory Visit: Payer: Self-pay | Admitting: Adult Health

## 2020-02-14 DIAGNOSIS — F32A Depression, unspecified: Secondary | ICD-10-CM

## 2020-02-14 DIAGNOSIS — F419 Anxiety disorder, unspecified: Secondary | ICD-10-CM

## 2020-02-25 ENCOUNTER — Encounter: Payer: Self-pay | Admitting: Adult Health

## 2020-02-26 DIAGNOSIS — Z87891 Personal history of nicotine dependence: Secondary | ICD-10-CM | POA: Diagnosis not present

## 2020-02-26 DIAGNOSIS — F331 Major depressive disorder, recurrent, moderate: Secondary | ICD-10-CM | POA: Diagnosis not present

## 2020-02-26 DIAGNOSIS — R45851 Suicidal ideations: Secondary | ICD-10-CM | POA: Diagnosis not present

## 2020-02-26 DIAGNOSIS — F419 Anxiety disorder, unspecified: Secondary | ICD-10-CM | POA: Diagnosis not present

## 2020-02-26 DIAGNOSIS — Z20822 Contact with and (suspected) exposure to covid-19: Secondary | ICD-10-CM | POA: Diagnosis not present

## 2020-02-26 DIAGNOSIS — F1290 Cannabis use, unspecified, uncomplicated: Secondary | ICD-10-CM | POA: Diagnosis not present

## 2020-02-26 DIAGNOSIS — F902 Attention-deficit hyperactivity disorder, combined type: Secondary | ICD-10-CM | POA: Diagnosis not present

## 2020-02-26 DIAGNOSIS — F332 Major depressive disorder, recurrent severe without psychotic features: Secondary | ICD-10-CM | POA: Diagnosis not present

## 2020-02-26 DIAGNOSIS — F418 Other specified anxiety disorders: Secondary | ICD-10-CM | POA: Diagnosis not present

## 2020-02-26 DIAGNOSIS — R450 Nervousness: Secondary | ICD-10-CM | POA: Diagnosis not present

## 2020-02-26 DIAGNOSIS — Z79899 Other long term (current) drug therapy: Secondary | ICD-10-CM | POA: Diagnosis not present

## 2020-02-26 DIAGNOSIS — F988 Other specified behavioral and emotional disorders with onset usually occurring in childhood and adolescence: Secondary | ICD-10-CM | POA: Diagnosis not present

## 2020-02-26 DIAGNOSIS — F329 Major depressive disorder, single episode, unspecified: Secondary | ICD-10-CM | POA: Diagnosis not present

## 2020-02-26 DIAGNOSIS — Z880 Allergy status to penicillin: Secondary | ICD-10-CM | POA: Diagnosis not present

## 2020-03-03 DIAGNOSIS — F331 Major depressive disorder, recurrent, moderate: Secondary | ICD-10-CM | POA: Diagnosis not present

## 2020-03-03 DIAGNOSIS — F902 Attention-deficit hyperactivity disorder, combined type: Secondary | ICD-10-CM | POA: Diagnosis not present

## 2020-03-09 ENCOUNTER — Encounter: Payer: Self-pay | Admitting: Adult Health

## 2020-03-09 ENCOUNTER — Other Ambulatory Visit: Payer: Self-pay

## 2020-03-09 ENCOUNTER — Ambulatory Visit (INDEPENDENT_AMBULATORY_CARE_PROVIDER_SITE_OTHER): Payer: BC Managed Care – PPO | Admitting: Adult Health

## 2020-03-09 VITALS — BP 100/80 | HR 88 | Temp 98.8°F | Wt 236.2 lb

## 2020-03-09 DIAGNOSIS — F32A Depression, unspecified: Secondary | ICD-10-CM

## 2020-03-09 DIAGNOSIS — F419 Anxiety disorder, unspecified: Secondary | ICD-10-CM | POA: Diagnosis not present

## 2020-03-09 DIAGNOSIS — F329 Major depressive disorder, single episode, unspecified: Secondary | ICD-10-CM | POA: Diagnosis not present

## 2020-03-09 DIAGNOSIS — F902 Attention-deficit hyperactivity disorder, combined type: Secondary | ICD-10-CM | POA: Diagnosis not present

## 2020-03-09 DIAGNOSIS — F331 Major depressive disorder, recurrent, moderate: Secondary | ICD-10-CM | POA: Diagnosis not present

## 2020-03-09 MED ORDER — BUPROPION HCL ER (XL) 150 MG PO TB24: 150.0000 mg | ORAL_TABLET | Freq: Every day | ORAL | 0 refills | Status: DC
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
## 2020-03-19 DIAGNOSIS — F902 Attention-deficit hyperactivity disorder, combined type: Secondary | ICD-10-CM | POA: Diagnosis not present

## 2020-03-19 DIAGNOSIS — F331 Major depressive disorder, recurrent, moderate: Secondary | ICD-10-CM | POA: Diagnosis not present

## 2020-04-07 IMAGING — DX DG CHEST 2V
2 series · 2 of 2 positions shown · non-contrast
Comparison: March 20, 2017

CLINICAL DATA: Cough and congestion.  Chest tightness.

EXAM:
CHEST - 2 VIEW

[chest pa]
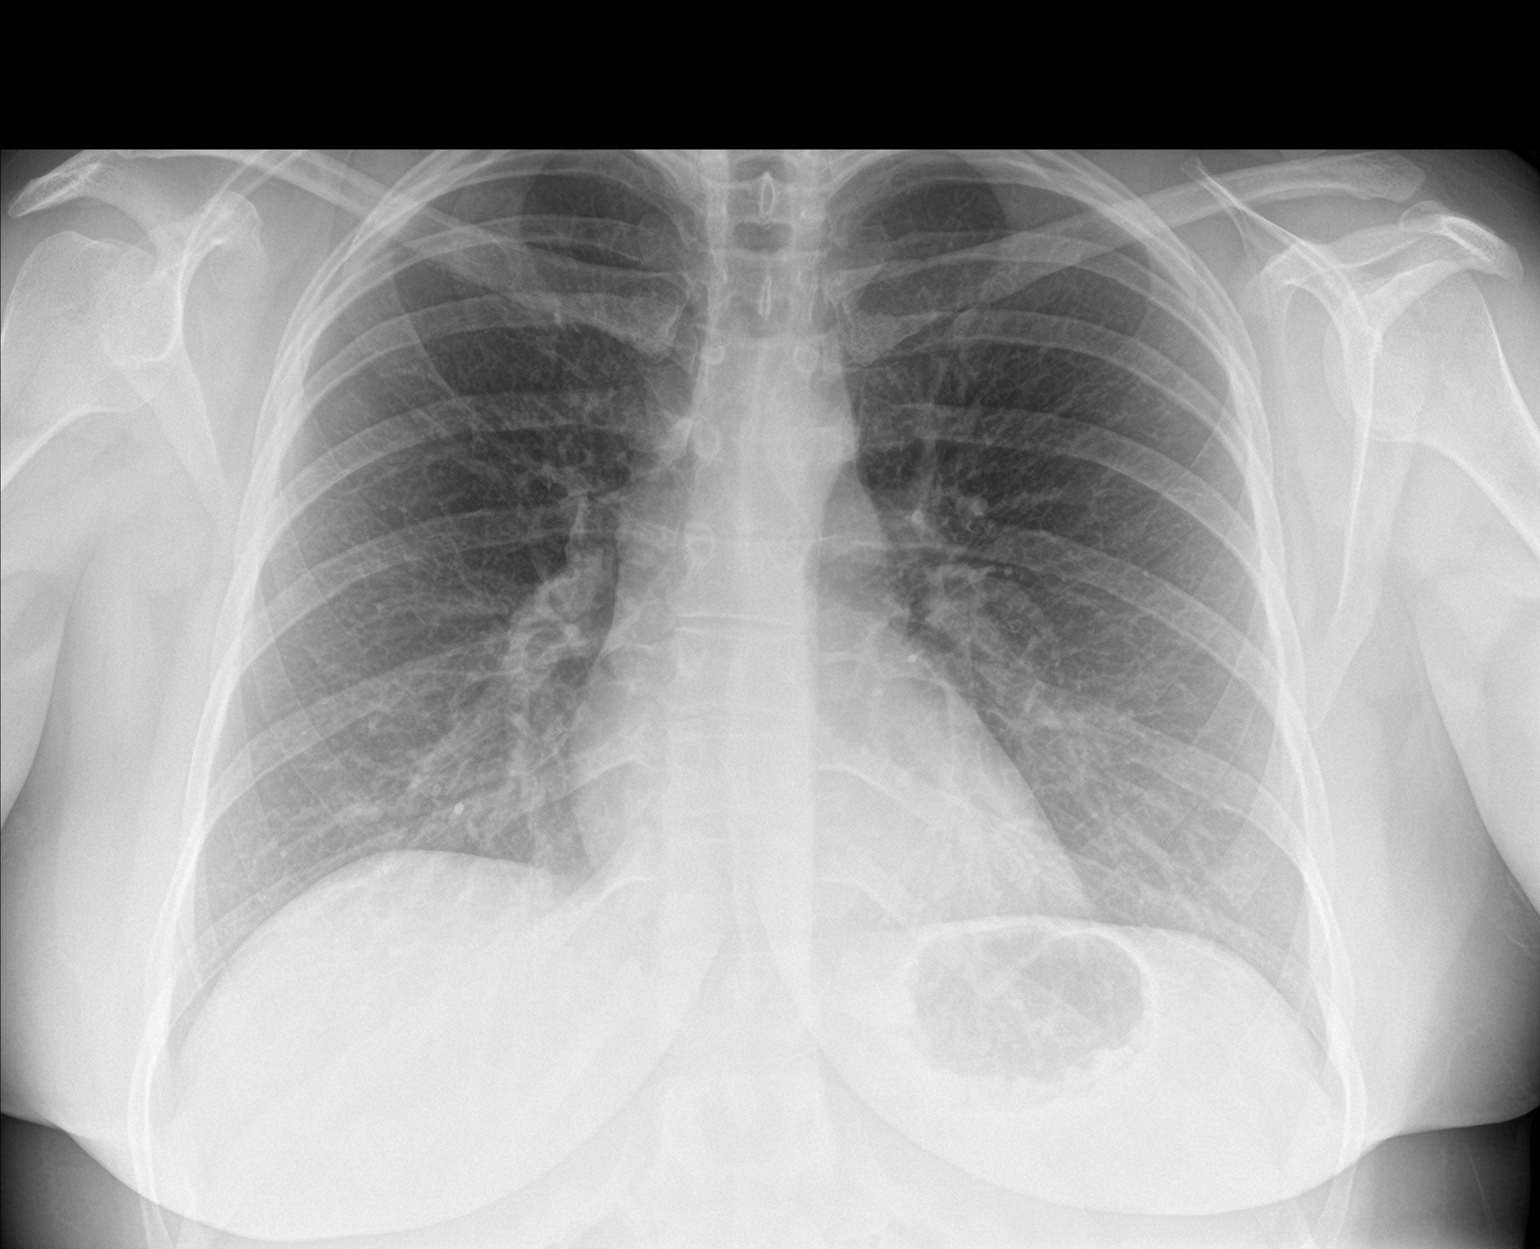

[chest lat]
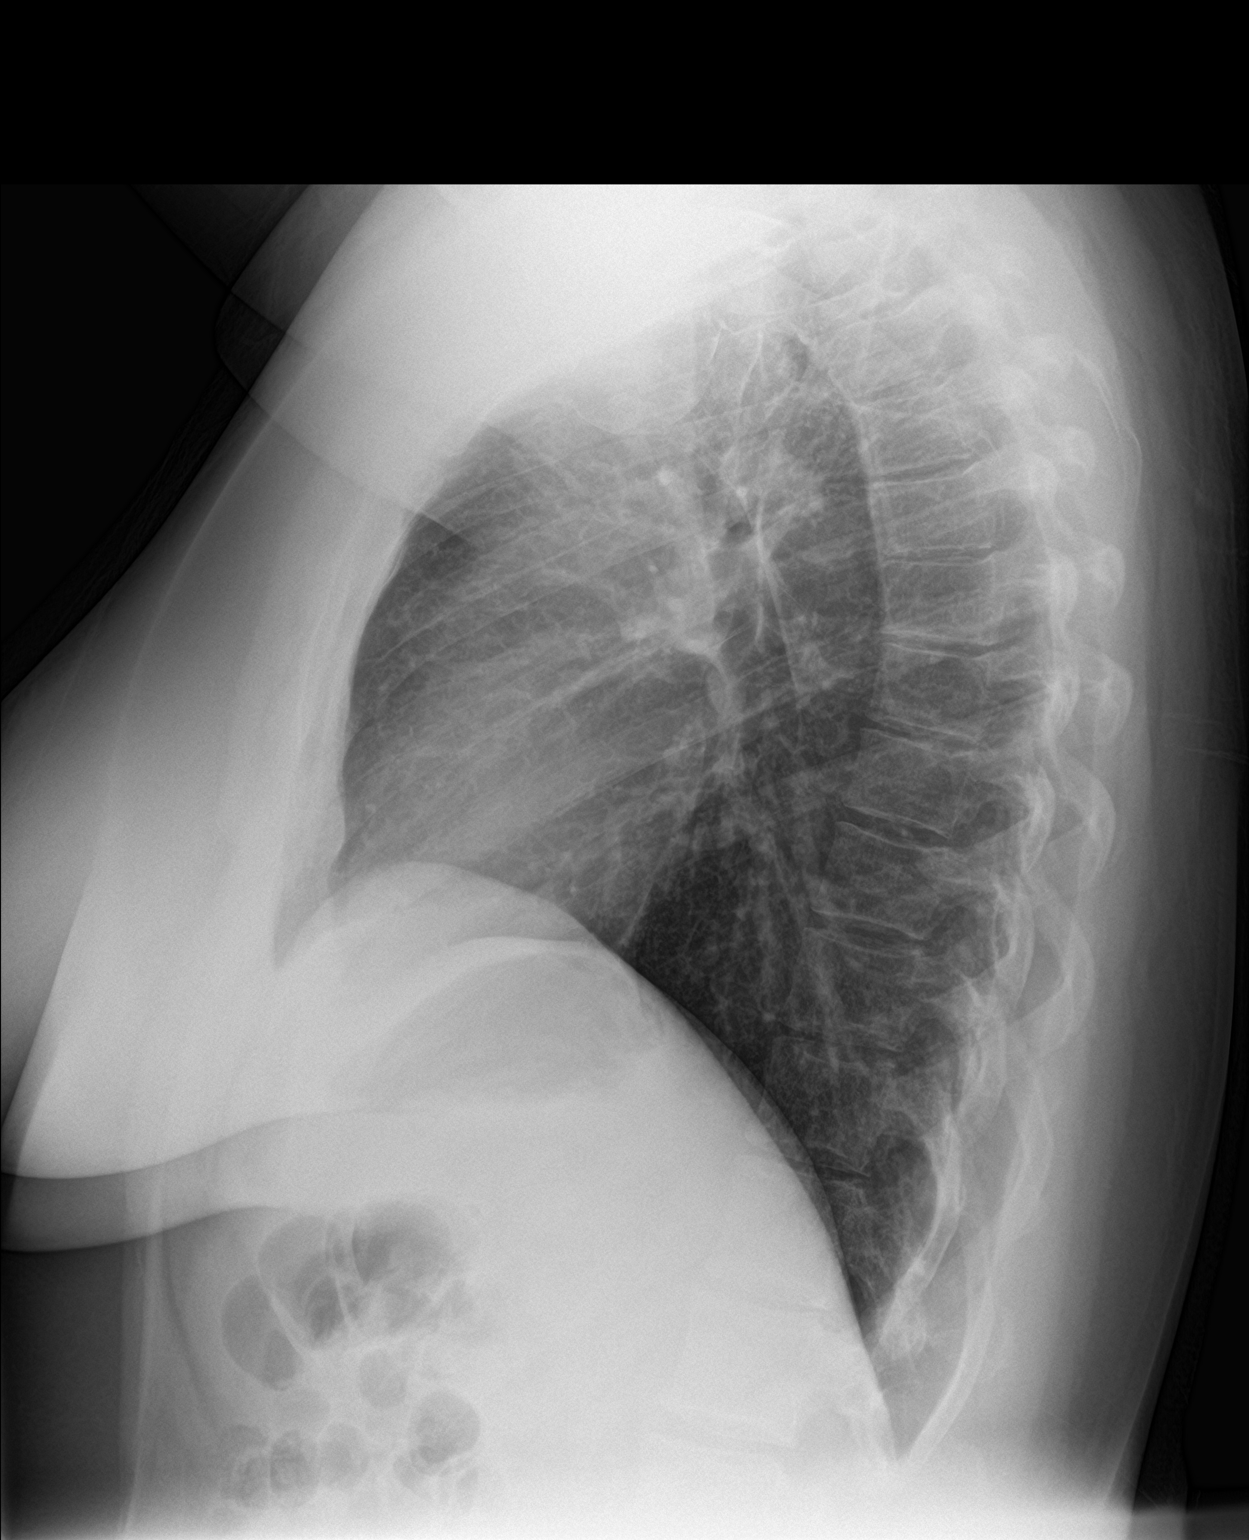

[2 of 2 positions shown; findings below may reference images not displayed]

FINDINGS: The heart size and mediastinal contours are within normal limits.
Both lungs are clear. The visualized skeletal structures are
unremarkable.
IMPRESSION: No active cardiopulmonary disease.

## 2020-08-29 ENCOUNTER — Encounter: Payer: Self-pay | Admitting: Adult Health

## 2020-08-30 ENCOUNTER — Other Ambulatory Visit: Payer: Self-pay

## 2020-08-30 DIAGNOSIS — F419 Anxiety disorder, unspecified: Secondary | ICD-10-CM

## 2020-08-30 DIAGNOSIS — F32A Depression, unspecified: Secondary | ICD-10-CM

## 2020-08-30 MED ORDER — ESCITALOPRAM OXALATE 20 MG PO TABS: 20.0000 mg | ORAL_TABLET | Freq: Every day | ORAL | 0 refills | Status: DC

## 2020-12-18 ENCOUNTER — Other Ambulatory Visit: Payer: Self-pay | Admitting: Adult Health

## 2020-12-18 DIAGNOSIS — F419 Anxiety disorder, unspecified: Secondary | ICD-10-CM

## 2020-12-18 DIAGNOSIS — F32A Depression, unspecified: Secondary | ICD-10-CM

## 2020-12-20 ENCOUNTER — Encounter: Payer: Self-pay | Admitting: Adult Health

## 2020-12-21 ENCOUNTER — Other Ambulatory Visit: Payer: Self-pay | Admitting: Adult Health

## 2020-12-21 DIAGNOSIS — F419 Anxiety disorder, unspecified: Secondary | ICD-10-CM

## 2020-12-21 DIAGNOSIS — F32A Depression, unspecified: Secondary | ICD-10-CM

## 2020-12-21 MED ORDER — LORAZEPAM 0.5 MG PO TABS: 0.5000 mg | ORAL_TABLET | Freq: Three times a day (TID) | ORAL | 0 refills | Status: DC | PRN

## 2020-12-31 ENCOUNTER — Other Ambulatory Visit: Payer: Self-pay | Admitting: Adult Health

## 2020-12-31 DIAGNOSIS — B009 Herpesviral infection, unspecified: Secondary | ICD-10-CM

## 2021-01-17 ENCOUNTER — Encounter: Payer: Self-pay | Admitting: Adult Health

## 2021-01-18 NOTE — Telephone Encounter (Signed)
Please advise 

## 2021-01-26 ENCOUNTER — Encounter: Payer: Self-pay | Admitting: Adult Health

## 2021-01-26 ENCOUNTER — Telehealth (INDEPENDENT_AMBULATORY_CARE_PROVIDER_SITE_OTHER): Payer: BC Managed Care – PPO | Admitting: Adult Health

## 2021-01-26 VITALS — Ht 66.5 in | Wt 236.0 lb

## 2021-01-26 DIAGNOSIS — F902 Attention-deficit hyperactivity disorder, combined type: Secondary | ICD-10-CM

## 2021-01-26 MED ORDER — VYVANSE 30 MG PO CHEW: 30.0000 mg | CHEWABLE_TABLET | Freq: Every day | ORAL | 0 refills | Status: AC

## 2021-01-26 NOTE — Progress Notes (Signed)
Virtual Visit via Video Note  I connected with Angela Vang on 01/26/21 at  3:30 PM EDT by a video enabled telemedicine application and verified that I am speaking with the correct person using two identifiers.  Location patient: home Location provider:work or home office Persons participating in the virtual visit: patient, provider  I discussed the limitations of evaluation and management by telemedicine and the availability of in person appointments. The patient expressed understanding and agreed to proceed.   HPI: 35 year old female who is being evaluated today for ADHD.  In the past she was seen at [facility] managed with Vyvanse 30 mg chewable tablets.  With her new insurance it is very expensive for her to be seen at [facility].  She is wondering if I can take over her ADHD medication.  She has noticed that without being on her medication she is having trouble paying attention at work, multitasking, being able to keep her thoughts straight on how to get her assignments done, and is having issues with talking over people so she can get her thoughts out before she forgets.  She also finds it helpful at home when she has the medication so she can get her errands done on time and not be as distracted.   ROS: See pertinent positives and negatives per HPI.  Past Medical History:  Diagnosis Date   ADHD (attention deficit hyperactivity disorder)    Anxiety    Asthma    Calculus of kidney    Depression    Herpes    cold sores no vag lesions   History of chickenpox    Hx of anorexia nervosa    Hx of gonorrhea    age 16   Hx of varicella    Multiple pregnancy loss, not currently pregnant 08/05/2011   x 2 first trimester  sees dr Pennie Rushing    Palpitations 03/27/2013   Telogen hair loss     Past Surgical History:  Procedure Laterality Date   CESAREAN SECTION N/A 05/22/2013   Procedure: Primary Cesarean Section Delivery Baby Girl @ 2137, Apgars 8/9;   Surgeon: Zelphia Cairo, MD;  Location: [facility];  Service: Obstetrics;  Laterality: N/A;   DILATION AND CURETTAGE OF UTERUS     KIDNEY STONE SURGERY     with stent   WISDOM TOOTH EXTRACTION      Family History  Problem Relation Age of Onset   Diabetes Mother    Hypertension Mother    Irritable bowel syndrome Mother    Depression Father    Bipolar disorder Father    Leukemia Maternal Grandmother    Mental illness Brother        suicide att   Mental illness Paternal Grandfather        suicide att   Breast cancer Maternal Aunt        Current Outpatient Medications:    cetirizine (ZYRTEC) 10 MG tablet, Take 10 mg by mouth daily., Disp: , Rfl:    escitalopram (LEXAPRO) 20 MG tablet, Take 1 tablet (20 mg total) by mouth daily., Disp: 90 tablet, Rfl: 1   escitalopram (LEXAPRO) 20 MG tablet, Take 1 tablet (20 mg total) by mouth daily., Disp: 90 tablet, Rfl: 0   fluticasone (FLONASE) 50 MCG/ACT nasal spray, Place 1 spray into both nostrils as needed for allergies or rhinitis., Disp: , Rfl:    ipratropium-albuterol (DUONEB) 0.5-2.5 (3) MG/3ML SOLN, TAKE 3 MLS BY NEBULIZATION EVERY 6 (SIX) HOURS AS NEEDED., Disp: 360 mL, Rfl:  1   LORazepam (ATIVAN) 0.5 MG tablet, Take 1 tablet (0.5 mg total) by mouth every 8 (eight) hours as needed for anxiety., Disp: 30 tablet, Rfl: 0   Multiple Vitamins-Minerals (MULTIVITAMIN PO), Take by mouth., Disp: , Rfl:   EXAM:  VITALS per patient if applicable:  GENERAL: alert, oriented, appears well and in no acute distress  HEENT: atraumatic, conjunttiva clear, no obvious abnormalities on inspection of external nose and ears  NECK: normal movements of the head and neck  LUNGS: on inspection no signs of respiratory distress, breathing rate appears normal, no obvious gross SOB, gasping or wheezing  CV: no obvious cyanosis  MS: moves all visible extremities without noticeable abnormality  PSYCH/NEURO: pleasant and cooperative, no obvious depression or  anxiety, speech and thought processing grossly intact  ASSESSMENT AND PLAN:  Discussed the following assessment and plan:  1. Attention deficit hyperactivity disorder (ADHD), combined type -We will take over management of her ADHD.  She understands that she needs to follow-up every 3 months for further medication refills. - Lisdexamfetamine Dimesylate (VYVANSE) 30 MG CHEW; Chew 30 mg by mouth daily.  Dispense: 30 tablet; Refill: 0 - Lisdexamfetamine Dimesylate (VYVANSE) 30 MG CHEW; Chew 30 mg by mouth daily.  Dispense: 30 tablet; Refill: 0 - Lisdexamfetamine Dimesylate (VYVANSE) 30 MG CHEW; Chew 30 mg by mouth daily.  Dispense: 30 tablet; Refill: 0      I discussed the assessment and treatment plan with the patient. The patient was provided an opportunity to ask questions and all were answered. The patient agreed with the plan and demonstrated an understanding of the instructions.   The patient was advised to call back or seek an in-person evaluation if the symptoms worsen or if the condition fails to improve as anticipated.   Shirline Frees, NP

## 2021-03-16 ENCOUNTER — Other Ambulatory Visit: Payer: Self-pay | Admitting: Adult Health

## 2021-03-16 DIAGNOSIS — F419 Anxiety disorder, unspecified: Secondary | ICD-10-CM

## 2021-04-07 ENCOUNTER — Other Ambulatory Visit: Payer: Self-pay

## 2021-04-07 ENCOUNTER — Telehealth (INDEPENDENT_AMBULATORY_CARE_PROVIDER_SITE_OTHER): Payer: BC Managed Care – PPO | Admitting: Family Medicine

## 2021-04-07 DIAGNOSIS — U071 COVID-19: Secondary | ICD-10-CM

## 2021-04-07 MED ORDER — ALBUTEROL SULFATE HFA 108 (90 BASE) MCG/ACT IN AERS: 2.0000 | INHALATION_SPRAY | Freq: Four times a day (QID) | RESPIRATORY_TRACT | 0 refills | Status: DC | PRN

## 2021-04-07 MED ORDER — BENZONATATE 100 MG PO CAPS: ORAL_CAPSULE | ORAL | 0 refills | Status: AC

## 2021-04-07 NOTE — Progress Notes (Signed)
Virtual Visit via Video Note  I connected with Angela Vang  on 04/07/21 at 12:00 PM EDT by a video enabled telemedicine application and verified that I am speaking with the correct person using two identifiers.  Location patient: home, [place] Location provider:work or home office Persons participating in the virtual visit: patient, provider  I discussed the limitations of evaluation and management by telemedicine and the availability of in person appointments. The patient expressed understanding and agreed to proceed.   HPI:  Acute telemedicine visit for : -Onset: 7 days ago -Symptoms include: cough (worst symptoms), fatigue, some mild shortness of breath at times, still feels hot at times (no fever on thermometer), diarrhea the last few days -Denies:fevers the last few days, vomiting, inability to eat/drink/get out of bed -Has tried: alka seltzer, cough drops -Pertinent past medical history: history of asthma - has used inhalers in the past, has not used with this illness -Pertinent medication allergies:  Allergies  Allergen Reactions   Amoxicillin Itching  -COVID-19 vaccine status: Immunization History  Administered Date(s) Administered   Influenza,inj,Quad PF,6+ Mos 02/19/2014   PFIZER(Purple Top)SARS-COV-2 Vaccination 03/26/2020, 04/16/2020  Has not had any booster Denies any chance of pregnancy  ROS: See pertinent positives and negatives per HPI.  Past Medical History:  Diagnosis Date   ADHD (attention deficit hyperactivity disorder)    Anxiety    Asthma    Calculus of kidney    Depression    Herpes    cold sores no vag lesions   History of chickenpox    Hx of anorexia nervosa    Hx of gonorrhea    age 35   Hx of varicella    Multiple pregnancy loss, not currently pregnant 08/05/2011   x 2 first trimester  sees dr Pennie Rushing    Palpitations 03/27/2013   Telogen hair loss     Past Surgical History:  Procedure Laterality Date   CESAREAN SECTION N/A 05/22/2013    Procedure: Primary Cesarean Section Delivery Baby Girl @ 2137, Apgars 8/9;  Surgeon: Zelphia Cairo, MD;  Location: [facility];  Service: Obstetrics;  Laterality: N/A;   DILATION AND CURETTAGE OF UTERUS     KIDNEY STONE SURGERY     with stent   WISDOM TOOTH EXTRACTION       Current Outpatient Medications:    albuterol (PROAIR HFA) 108 (90 Base) MCG/ACT inhaler, Inhale 2 puffs into the lungs every 6 (six) hours as needed for wheezing or shortness of breath., Disp: 1 each, Rfl: 0   benzonatate (TESSALON PERLES) 100 MG capsule, 1-2 capsules twice daily as needed for cough, Disp: 30 capsule, Rfl: 0   cetirizine (ZYRTEC) 10 MG tablet, Take 10 mg by mouth daily., Disp: , Rfl:    escitalopram (LEXAPRO) 20 MG tablet, TAKE 1 TABLET BY MOUTH EVERY DAY, Disp: 90 tablet, Rfl: 0   fluticasone (FLONASE) 50 MCG/ACT nasal spray, Place 1 spray into both nostrils as needed for allergies or rhinitis., Disp: , Rfl:    ipratropium-albuterol (DUONEB) 0.5-2.5 (3) MG/3ML SOLN, TAKE 3 MLS BY NEBULIZATION EVERY 6 (SIX) HOURS AS NEEDED., Disp: 360 mL, Rfl: 1   Lisdexamfetamine Dimesylate (VYVANSE) 30 MG CHEW, Chew 30 mg by mouth daily., Disp: 30 tablet, Rfl: 0   Lisdexamfetamine Dimesylate (VYVANSE) 30 MG CHEW, Chew 30 mg by mouth daily., Disp: 30 tablet, Rfl: 0   Lisdexamfetamine Dimesylate (VYVANSE) 30 MG CHEW, Chew 30 mg by mouth daily., Disp: 30 tablet, Rfl: 0   LORazepam (ATIVAN) 0.5 MG tablet, Take 1  tablet (0.5 mg total) by mouth every 8 (eight) hours as needed for anxiety., Disp: 30 tablet, Rfl: 0   Multiple Vitamins-Minerals (MULTIVITAMIN PO), Take by mouth., Disp: , Rfl:   EXAM:  VITALS per patient if applicable:  GENERAL: alert, oriented, appears well and in no acute distress  HEENT: atraumatic, conjunttiva clear, no obvious abnormalities on inspection of external nose and ears  NECK: normal movements of the head and neck  LUNGS: on inspection no signs of respiratory distress, breathing rate appears  normal, no obvious gross SOB, gasping or wheezing  CV: no obvious cyanosis  MS: moves all visible extremities without noticeable abnormality  PSYCH/NEURO: pleasant and cooperative, no obvious depression or anxiety, speech and thought processing grossly intact  ASSESSMENT AND PLAN:  Discussed the following assessment and plan:  COVID-19  -we discussed possible serious and likely etiologies, options for evaluation and workup, limitations of telemedicine visit vs in person visit, treatment, treatment risks and precautions. Pt is agreeable to treatment via telemedicine at this moment.  Seems she is still dealing with COVID symptoms, which is not unusual in the first week or 2.  Discussed options for care and decided to treat with Tessalon for cough and an albuterol inhaler to use as needed.  Advised if any worsening of asthma symptoms, shortness of breath or chest pain that she seek prompt in person evaluation.  Discussed possible serious complications of COVID-19.  Other symptomatic care measures summarized in patient instructions. Work/School slipped offered: provided in patient instructions  Advised to seek prompt in person care if worsening, new symptoms arise, or if is not improving with treatment. Discussed options for inperson care if PCP office not available. Advised to schedule follow up visit with PCP or [facility] if any further questions or concerns to avoid delays in care.   I discussed the assessment and treatment plan with the patient. The patient was provided an opportunity to ask questions and all were answered. The patient agreed with the plan and demonstrated an understanding of the instructions.     Terressa Koyanagi, DO

## 2021-04-07 NOTE — Patient Instructions (Addendum)
---------------------------------------------------------------------------------------------------------------------------    WORK SLIP:  Patient Angela Vang,  11/09/1985, was seen for a medical visit today, 04/07/21 . Please excuse from work for a COVID/flu like illness. If Covid19 testing is positive advise 10 days minimum from the onset of symptoms (03/31/21) PLUS 1 day of no fever and improved symptoms. Will defer to employer for a sooner return to work if patient has 2 negative covid tests 48 hours apart and is feeling better, or if symptoms have resolved, it is greater than 5 days since the positive test and the patient can wear a high-quality, tight fitting mask such as N95 or KN95 at all times for an additional 5 days. Would also suggest COVID19 antigen testing is negative prior to early return from a Covid illness.  Sincerely: E-signature: Dr. Kriste Basque, DO [facility] [place] Ph: [phone]   ------------------------------------------------------------------------------------------------------------------------------   HOME CARE TIPS:  -COVID19 testing information: [email or web address].[email or web address]  Most pharmacies also offer testing and home test kits. If the Covid19 test is positive and you desire antiviral treatment, please contact a [place] pharmacy or schedule a follow up virtual visit through your primary care office or through the [employer].  Other test to treat options: [email or web address]-[email or web address].[email or web address]  -I sent the medication(s) we discussed to your pharmacy: Meds ordered this encounter  Medications   benzonatate (TESSALON PERLES) 100 MG capsule    Sig: 1-2 capsules twice daily as needed for cough    Dispense:  30 capsule    Refill:  0   albuterol (PROAIR HFA) 108 (90 Base) MCG/ACT inhaler    Sig: Inhale 2 puffs into the lungs every 6 (six) hours as needed for wheezing or shortness of breath.     Dispense:  1 each    Refill:  0    -Can try Imodium for the diarrhea if needed  -can use tylenol or aleve if needed for fevers, aches and pains per instructions  -can use nasal saline a few times per day if you have nasal congestion; sometimes  a short course of Afrin nasal spray for 3 days can help with symptoms as well  -stay hydrated, drink plenty of fluids and eat small healthy meals - avoid dairy  -can take 1000 IU ( ) Vit D3 and 100-500 mg of Vit C daily per instructions  -If the Covid test is positive, check out the [facility] website for more information on home care, transmission and treatment for COVID19  -follow up with your doctor in 2-3 days unless improving and feeling better  -stay home while sick, except to seek medical care. If you have COVID19, ideally it would be best to stay home for a full 10 days since the onset of symptoms PLUS one day of no fever and feeling better. Wear a good mask that fits snugly (such as N95 or KN95) if around others to reduce the risk of transmission.  It was nice to meet you today, and I really hope you are feeling better soon. I help [facility] out with telemedicine visits on Tuesdays and Thursdays and am available for visits on those days. If you have any concerns or questions following this visit please schedule a follow up visit with your Primary Care doctor or seek care at a local urgent care clinic to avoid delays in care.    Seek in person care or schedule a follow up video visit promptly if your symptoms worsen, new concerns arise or you are not improving with treatment. Call 911  and/or seek emergency care if your symptoms are severe or life threatening.

## 2021-04-08 ENCOUNTER — Encounter: Payer: Self-pay | Admitting: Adult Health

## 2021-04-08 ENCOUNTER — Other Ambulatory Visit: Payer: Self-pay | Admitting: Adult Health

## 2021-04-08 MED ORDER — PREDNISONE 10 MG PO TABS: ORAL_TABLET | ORAL | 0 refills | Status: DC

## 2021-04-08 MED ORDER — HYDROCODONE BIT-HOMATROP MBR 5-1.5 MG/5ML PO SOLN: 5.0000 mL | Freq: Three times a day (TID) | ORAL | 0 refills | Status: DC | PRN

## 2021-04-08 NOTE — Telephone Encounter (Signed)
Please advise 

## 2021-05-27 ENCOUNTER — Other Ambulatory Visit: Payer: Self-pay | Admitting: Family Medicine

## 2021-06-02 MED ORDER — ALBUTEROL SULFATE HFA 108 (90 BASE) MCG/ACT IN AERS: 2.0000 | INHALATION_SPRAY | Freq: Four times a day (QID) | RESPIRATORY_TRACT | 0 refills | Status: DC | PRN

## 2021-06-30 ENCOUNTER — Other Ambulatory Visit: Payer: Self-pay | Admitting: Adult Health

## 2021-06-30 DIAGNOSIS — F419 Anxiety disorder, unspecified: Secondary | ICD-10-CM

## 2021-06-30 DIAGNOSIS — F32A Depression, unspecified: Secondary | ICD-10-CM

## 2021-07-01 ENCOUNTER — Other Ambulatory Visit: Payer: Self-pay | Admitting: Adult Health

## 2021-07-01 ENCOUNTER — Encounter: Payer: Self-pay | Admitting: Adult Health

## 2021-07-01 ENCOUNTER — Telehealth: Payer: Self-pay

## 2021-07-01 DIAGNOSIS — F32A Depression, unspecified: Secondary | ICD-10-CM

## 2021-07-01 DIAGNOSIS — F419 Anxiety disorder, unspecified: Secondary | ICD-10-CM

## 2021-07-01 MED ORDER — ESCITALOPRAM OXALATE 20 MG PO TABS: 20.0000 mg | ORAL_TABLET | Freq: Every day | ORAL | 0 refills | Status: DC

## 2021-07-01 MED ORDER — LORAZEPAM 0.5 MG PO TABS: 0.5000 mg | ORAL_TABLET | Freq: Three times a day (TID) | ORAL | 0 refills | Status: AC | PRN

## 2021-07-01 NOTE — Telephone Encounter (Signed)
Patient has Cpe scheduled and Rx was sent to the pharmacy

## 2021-07-22 ENCOUNTER — Encounter: Payer: BC Managed Care – PPO | Admitting: Adult Health

## 2021-08-02 ENCOUNTER — Other Ambulatory Visit: Payer: Self-pay | Admitting: Adult Health

## 2021-08-02 DIAGNOSIS — F419 Anxiety disorder, unspecified: Secondary | ICD-10-CM

## 2021-08-02 DIAGNOSIS — F32A Depression, unspecified: Secondary | ICD-10-CM

## 2021-08-03 NOTE — Telephone Encounter (Signed)
Pt will need to come in for CPE. Pt canceled last CPE after refill was sent in.

## 2021-08-17 ENCOUNTER — Encounter: Payer: Self-pay | Admitting: Adult Health

## 2021-08-18 ENCOUNTER — Other Ambulatory Visit: Payer: Self-pay | Admitting: Adult Health

## 2021-08-18 DIAGNOSIS — F419 Anxiety disorder, unspecified: Secondary | ICD-10-CM

## 2021-08-18 MED ORDER — ESCITALOPRAM OXALATE 20 MG PO TABS: 20.0000 mg | ORAL_TABLET | Freq: Every day | ORAL | 0 refills | Status: AC

## 2021-08-18 NOTE — Telephone Encounter (Signed)
Please advise 

## 2022-09-07 ENCOUNTER — Ambulatory Visit: Payer: BC Managed Care – PPO | Admitting: Family Medicine
